# Patient Record
Sex: Female | Born: 1995 | Race: Black or African American | Hispanic: No | Marital: Single | Smoking: Never smoker
Health system: Southern US, Community
[De-identification: ages and names within clinical notes are randomized; demographics above are authoritative.]

## PROBLEM LIST (undated history)

## (undated) DIAGNOSIS — I10 Essential (primary) hypertension: Secondary | ICD-10-CM

## (undated) DIAGNOSIS — T7840XA Allergy, unspecified, initial encounter: Secondary | ICD-10-CM

## (undated) DIAGNOSIS — O1420 HELLP syndrome (HELLP), unspecified trimester: Secondary | ICD-10-CM

## (undated) DIAGNOSIS — J45909 Unspecified asthma, uncomplicated: Secondary | ICD-10-CM

## (undated) DIAGNOSIS — R519 Headache, unspecified: Secondary | ICD-10-CM

## (undated) HISTORY — PX: WISDOM TOOTH EXTRACTION: SHX21

## (undated) HISTORY — DX: Allergy, unspecified, initial encounter: T78.40XA

## (undated) HISTORY — DX: HELLP syndrome (HELLP), unspecified trimester: O14.20

## (undated) HISTORY — DX: Essential (primary) hypertension: I10

---

## 2013-04-10 DIAGNOSIS — M549 Dorsalgia, unspecified: Secondary | ICD-10-CM | POA: Insufficient documentation

## 2013-06-12 DIAGNOSIS — J45909 Unspecified asthma, uncomplicated: Secondary | ICD-10-CM | POA: Insufficient documentation

## 2014-09-08 ENCOUNTER — Emergency Department (HOSPITAL_COMMUNITY)
Admission: EM | Admit: 2014-09-08 | Discharge: 2014-09-08 | Disposition: A | Discharge status: Home / Self Care | Payer: PRIVATE HEALTH INSURANCE | Attending: Emergency Medicine | Admitting: Emergency Medicine

## 2014-09-08 ENCOUNTER — Encounter (HOSPITAL_COMMUNITY): Payer: Self-pay | Admitting: Emergency Medicine

## 2014-09-08 DIAGNOSIS — M545 Low back pain, unspecified: Secondary | ICD-10-CM | POA: Insufficient documentation

## 2014-09-08 DIAGNOSIS — E663 Overweight: Secondary | ICD-10-CM | POA: Diagnosis not present

## 2014-09-08 MED ORDER — HYDROCODONE-ACETAMINOPHEN 5-325 MG PO TABS
2.0000 | ORAL_TABLET | Freq: Once | ORAL | Status: AC
  Administered 2014-09-08: 2 via ORAL
  Filled 2014-09-08: qty 2

## 2014-09-08 MED ORDER — METHOCARBAMOL 500 MG PO TABS: 1000.0000 mg | ORAL_TABLET | Freq: Four times a day (QID) | ORAL | Status: DC | PRN

## 2014-09-08 MED ORDER — HYDROCODONE-ACETAMINOPHEN 5-325 MG PO TABS: ORAL_TABLET | ORAL | Status: DC

## 2014-09-08 MED ORDER — METHOCARBAMOL 500 MG PO TABS
1000.0000 mg | ORAL_TABLET | Freq: Once | ORAL | Status: AC
  Administered 2014-09-08: 1000 mg via ORAL
  Filled 2014-09-08: qty 2

## 2014-09-08 NOTE — ED Notes (Signed)
Pt reports lower back pain for the past week- denies new injury.  Pt reports having imaging done in the past that showed bulging discs but she cannot remember location.  Denies pain or difficulty with urination.  Ambulatory from triage without difficulty.

## 2014-09-08 NOTE — Discharge Instructions (Signed)
Please take ibuprofen 400mg  (this is normally 2 over the counter pills) every 6 hours (take with food to minimze stomach irritation).   Take robaxin and/or Vicodin for breakthrough pain, do not drink alcohol, drive, care for children or perfom other critical tasks while taking robaxin and/or Vicodin .  Please follow with your primary care doctor in the next 2 days for a check-up. They must obtain records for further management.   Do not hesitate to return to the Emergency Department for any new, worsening or concerning symptoms.   Do not hesitate to return to the emergency room for any new, worsening or concerning symptoms.  Please obtain primary care using resource guide below. But the minute you were seen in the emergency room and that they will need to obtain records for further outpatient management.   Back Pain, Adult Low back pain is very common. About 1 in 5 people have back pain.The cause of low back pain is rarely dangerous. The pain often gets better over time.About half of people with a sudden onset of back pain feel better in just 2 weeks. About 8 in 10 people feel better by 6 weeks.  CAUSES Some common causes of back pain include:  Strain of the muscles or ligaments supporting the spine.  Wear and tear (degeneration) of the spinal discs.  Arthritis.  Direct injury to the back. DIAGNOSIS Most of the time, the direct cause of low back pain is not known.However, back pain can be treated effectively even when the exact cause of the pain is unknown.Answering your caregiver's questions about your overall health and symptoms is one of the most accurate ways to make sure the cause of your pain is not dangerous. If your caregiver needs more information, he or she may order lab work or imaging tests (X-rays or MRIs).However, even if imaging tests show changes in your back, this usually does not require surgery. HOME CARE INSTRUCTIONS For many people, back pain returns.Since low  back pain is rarely dangerous, it is often a condition that people can learn to [facility] their own.   Remain active. It is stressful on the back to sit or stand in one place. Do not sit, drive, or stand in one place for more than 30 minutes at a time. Take short walks on level surfaces as soon as pain allows.Try to increase the length of time you walk each day.  Do not stay in bed.Resting more than 1 or 2 days can delay your recovery.  Do not avoid exercise or work.Your body is made to move.It is not dangerous to be active, even though your back may hurt.Your back will likely heal faster if you return to being active before your pain is gone.  Pay attention to your body when you bend and lift. Many people have less discomfortwhen lifting if they bend their knees, keep the load close to their bodies,and avoid twisting. Often, the most comfortable positions are those that put less stress on your recovering back.  Find a comfortable position to sleep. Use a firm mattress and lie on your side with your knees slightly bent. If you lie on your back, put a pillow under your knees.  Only take over-the-counter or prescription medicines as directed by your caregiver. Over-the-counter medicines to reduce pain and inflammation are often the most helpful.Your caregiver may prescribe muscle relaxant drugs.These medicines help dull your pain so you can more quickly return to your normal activities and healthy exercise.  Put ice on the  injured area.  Put ice in a plastic bag.  Place a towel between your skin and the bag.  Leave the ice on for 15-20 minutes, 03-04 times a day for the first 2 to 3 days. After that, ice and heat may be alternated to reduce pain and spasms.  Ask your caregiver about trying back exercises and gentle massage. This may be of some benefit.  Avoid feeling anxious or stressed.Stress increases muscle tension and can worsen back pain.It is important to recognize when you  are anxious or stressed and learn ways to manage it.Exercise is a great option. SEEK MEDICAL CARE IF:  You have pain that is not relieved with rest or medicine.  You have pain that does not improve in 1 week.  You have new symptoms.  You are generally not feeling well. SEEK IMMEDIATE MEDICAL CARE IF:   You have pain that radiates from your back into your legs.  You develop new bowel or bladder control problems.  You have unusual weakness or numbness in your arms or legs.  You develop nausea or vomiting.  You develop abdominal pain.  You feel faint. Document Released: 12/05/2005 Document Revised: 06/05/2012 Document Reviewed: 04/08/2014 [facility] Patient Information 2015 [place], [place]. This information is not intended to replace advice given to you by your health care provider. Make sure you discuss any questions you have with your health care provider.  Emergency Department Resource Guide 1) Find a Doctor and Pay Out of Pocket Although you won't have to find out who is covered by your insurance plan, it is a good idea to ask around and get recommendations. You will then need to call the office and see if the doctor you have chosen will accept you as a new patient and what types of options they offer for patients who are self-pay. Some doctors offer discounts or will set up payment plans for their patients who do not have insurance, but you will need to ask so you aren't surprised when you get to your appointment.  2) Contact Your Local Health Department Not all health departments have doctors that can see patients for sick visits, but many do, so it is worth a call to see if yours does. If you don't know where your local health department is, you can check in your phone book. The CDC also has a tool to help you locate your state's health department, and many state websites also have listings of all of their local health departments.  3) Find a Walk-in Clinic If your illness is not  likely to be very severe or complicated, you may want to try a walk in clinic. These are popping up all over the country in pharmacies, drugstores, and shopping centers. They're usually staffed by nurse practitioners or physician assistants that have been trained to treat common illnesses and complaints. They're usually fairly quick and inexpensive. However, if you have serious medical issues or chronic medical problems, these are probably not your best option.  No Primary Care Doctor: - Call Health Connect at  [phone] - they can help you locate a primary care doctor that  accepts your insurance, provides certain services, etc. - Physician Referral Service- [phone]  Chronic Pain Problems: Organization         Address  Phone   Notes  [facility]  [phone] Patients need to be referred by their primary care doctor.   Medication Assistance: Organization         Address  Phone   Notes  [facility] Medication [facility] [address] [place] [place], [place] [address] [phone] --Must be a resident of [facility] -- Must have NO insurance coverage whatsoever (no Medicaid/ Medicare, etc.) -- The pt. MUST have a primary care doctor that directs their care regularly and follows them in the community   Med[facility]ist  [phone]   [employer]  [phone]    Agencies that provide inexpensive medical care: Organization         Address  Phone   Notes  [facility]  [phone]   [facility]    [phone]   [facility] [address] [place], [place] [address] [phone]   [facility] [address] [place] [place] [phone]   Planned Parenthood    [phone]   [facility]    [phone]   [facility]  [address], [place] Phone:  [phone], Fax:  [phone] Hours of Operation:  9 am - 6 pm,  M-F.  Also accepts Medicaid/Medicare and self-pay.  [facility]  [address], [place] Phone: [phone], Fax: [phone]. Hours of Operation:  8:30 am - 5:30 pm, M-F.  Also accepts Medicaid and self-pay.  [facility] [place] [address] [place] Phone: [phone]   [facility] [address] Natasha Bence [place], [place] [phone], Ext. [phone] Mondays & Thursdays: 7-9 AM.  First 15 patients are seen on a first come, first serve basis.    Medicaid-accepting [facility] Providers:  Organization         Address  Phone   Notes  [facility] [address], [place] [phone] Also accepts self-pay patients.  [facility] [address] Laurell Josephs [place], [place]  [phone]   [facility] [address], [place] [phone]   [facility] [address] [place] [phone]   Renaye Rakers [address], [place]   [phone] Only accepts [place] Access [place] patients after they have their name applied to their card.   Self-Pay (no insurance) in [facility]:  Organization         Address  Phone   Notes  Sickle Cell Patients, [facility] [address] [place], [place] [phone]   [facility] [address] [place], [place] [phone]   [facility]  [address], [place] [phone]   [facility]/Dr. Osei-Bonsu  [address], [place] or [address], [place] [phone] Phone number for both [place] and [place] locations is the same.  [facility] [address] [place] [phone]   [facility] [address] [place] or [address] [phone]   [facility] [address] [place] [phone], phone; [phone], fax Sees patients 1st and 3rd Saturday of every month.  Must not qualify for public or private insurance (i.e. Medicaid, Medicare, [place] Health Choice, Veterans' Benefits)  Household income should be no more than 200% of the poverty level The clinic cannot treat you if you are pregnant or think you are pregnant  Sexually transmitted diseases are not treated at the clinic.    Dental Care: Organization         Address  Phone  Notes  [facility] Department of [facility] [address] [place], [place] [phone] Accepts children up to age 45 who are enrolled in [place] or [place] Health Choice; pregnant women with a Medicaid card; and children who have applied for Medicaid or [place] Health Choice, but were declined, whose parents can pay a reduced fee at time of service.  [facility] Department of [facility]  [address], [place] [phone] Accepts children up to age 70 who are enrolled in [place] or [place] Health Choice; pregnant women with a Medicaid card; and children who have applied for Medicaid or [place] Health Choice, but were declined, whose parents can pay a reduced fee at time of service.  [facility]  [address] [place], [place] [phone] Patients are seen by appointment only. Walk-ins are not accepted. [facility] will see patients 56 years of age and older. Monday - Tuesday (8am-5pm) Most Wednesdays (8:30-5pm) $30 per visit, cash only  [facility]  [address], [facility] [phone] Patients are seen by appointment only. Walk-ins are not accepted. [facility] will see patients 59 years of age and older. One Wednesday Evening (Monthly: Volunteer Based).  $30 per visit, cash only  [employer]  [phone] for adults; Children under age 79, call Graduate Pediatric Dentistry at [phone]. Children aged 25-14, please call [phone] to request a pediatric application.  Dental services are provided in all areas of dental care including fillings, crowns and bridges, complete and partial dentures, implants, gum treatment, root canals, and extractions. Preventive care is also provided. Treatment is provided to both adults and children. Patients are selected via a lottery and there is often a waiting list.   [facility] [address] [place]  [phone] [email or web address]   [facility] [address] [place], [place] [phone], Ext. [phone] Second and Fourth Thursday of each month, opens at 6:30 AM; Clinic ends at 9 AM.  Patients are seen on a first-come first-served basis, and a limited number are seen during each clinic.   [facility]  [address] [place] [address] Ether Griffins [place], [place] [phone]   Eligibility Requirements You must have lived in [place], [place], or [place] counties for at least the last three months.   You cannot be eligible for state or federal sponsored [place], including [employer], [place], or [employer].   You generally cannot be eligible for healthcare insurance through your employer.    How to apply: Eligibility screenings are held every Tuesday and Wednesday afternoon from 1:00 pm until 4:00 pm. You do not need an appointment for the interview!  [facility] [address], [place], [place] [phone]   [facility] Health Department  [phone]   [facility] Department  [phone]   [facility] Health Department  [phone]    Behavioral Health Resources in the Community: Intensive Outpatient Programs Organization         Address  Phone  Notes  [facility] [address] [place], [place] [phone]   [facility] Outpatient [address] [place], [place] [phone]   [facility]  [address] [place], [place]  [phone]   [facility] Mental Health [address]  [place], [place] [phone] or [phone]   Substance Abuse Resources Organization         Address  Phone  Notes  [facility]  [phone]   [facility]  [phone]   [facility] [place]  [phone]   Floydene Flock  [phone]   Residential & Outpatient Substance Abuse Program  [phone]   Psychological Services Organization         Address  Phone  Notes  [facility]  [phone]   [facility] Services  [phone]   [facility] Mental Health [address] [place] [phone] or [phone]    Mobile Crisis Teams Organization         Address  Phone  Notes  [facility], Mobile Crisis Care Unit  [phone]   [facility]  [address] [place], [place] [phone]   Doristine Locks [address] [place] [phone]    Self-Help/Support Groups Organization         Address  Phone             Notes  [facility] - variety of support groups  [phone]- I7437963 Call for more information  Narcotics Anonymous (NA), [facility] [address], [employer] [place]  2 meetings at this location   Statistician         Address  Phone  Notes  [facility] [address],    [facility] [place]  [phone]   [facility]  [address] [place] [address], [place], [place] [phone]   [facility] [address] [place], [place] [phone] Admissions: 8am-3pm M-F  [facility] [address]    [place], [place] [phone]   [facility] [address] [place], [place], [place] [phone]   [facility] [address]  [place], [place] [phone]   [facility] - Intensive Outpatient [address] Laurell Josephs [address], [place], [place]  [phone]   [facility] ([facility]iction [facility] [address]  [place], [place] [phone] or [phone]   [facility] ([facility]) [address] [place], [place] [phone] Accepts Medicaid  [facility] [address],  [place] [phone] Substance Abuse/Addiction Treatment   [facility] Organization         Address  Phone  Notes  [facility]  [phone]   Angie Fava, PhD [address] Ervin Knack [place], [place]   [phone] or 9203382023   [facility]   [address] [place], [place] [phone]   [facility] [address] [place], [place] [phone] Insurance/Medicaid/sponsorship through [facility] [address]                                    [place], [place] [phone] Therapy/tele-psych/case  [facility] [address], [place] [phone]    Dr. Lolly Mustache  [phone]   [facility]  [employer] [facility] 1) [address] [place] 2) [address] [place] 3)  [address], [place] [phone]  [phone]   [facility] Child Abuse Hotline [phone] or [phone] (  After Hours)

## 2014-09-08 NOTE — ED Provider Notes (Signed)
CSN: [id number]     Arrival date & time 09/08/14  2109 History  This chart was scribed for non-physician practitioner, Brianna Emery, PA-C working with Mirian Mo, MD by Greggory Stallion, ED scribe. This patient was seen in room TR11C/TR11C and the patient's care was started at 10:19 PM.   Chief Complaint  Patient presents with  . Back Pain   The history is provided by the patient. No language interpreter was used.   HPI Comments: Brianna Gilmore is a 18 y.o. female with who presents to the Emergency Department complaining of gradual onset lower back pain that started one week ago. Denies new injury. Pain goes into her upper back in the mornings but states it starts resolving in the upper back throughout the day. Standing up worsens the pain and causes it to become very sharp. Reports history of lower back pain due to bulging discs but states it has recently worsened. Pt has taken tylenol #3, ibuprofen and flexeril with no relief. Denies history of hypertension, diabetes, caner or IV drug use. Denies fever, chills, bowel or bladder incontinence.   History reviewed. No pertinent past medical history. History reviewed. No pertinent past surgical history. No family history on file. History  Substance Use Topics  . Smoking status: Never Smoker   . Smokeless tobacco: Not on file  . Alcohol Use: No   OB History   Grav Para Term Preterm Abortions TAB SAB Ect Mult Living                 Review of Systems  Constitutional: Negative for fever and chills.  Genitourinary:       Negative for bowel or bladder incontinence.  Musculoskeletal: Positive for back pain.  All other systems reviewed and are negative.  Allergies  Review of patient's allergies indicates no known allergies.  Home Medications   Prior to Admission medications   Medication Sig Start Date End Date Taking? Authorizing Provider  HYDROcodone-acetaminophen (NORCO/VICODIN) 5-325 MG per tablet Take 1-2 tablets by mouth every  6 hours as needed for pain. 09/08/14   Loyola Santino, PA-C  methocarbamol (ROBAXIN) 500 MG tablet Take 2 tablets (1,000 mg total) by mouth 4 (four) times daily as needed (Pain). 09/08/14   Ama Mcmaster, PA-C   BP 132/66  Pulse 67  Temp(Src) 98 F (36.7 C)  Resp 13  Wt 254 lb 4 oz (115.327 kg)  SpO2 98%  Physical Exam  Nursing note and vitals reviewed. Constitutional: She is oriented to person, place, and time. She appears well-developed and well-nourished. No distress.  Overweight  HENT:  Head: Normocephalic.  Eyes: Conjunctivae and EOM are normal.  Neck: Normal range of motion.  Cardiovascular: Normal rate, regular rhythm and intact distal pulses.   Pulmonary/Chest: Effort normal. No stridor.  Abdominal: Soft. There is no tenderness.  Musculoskeletal: Normal range of motion.  Neurological: She is alert and oriented to person, place, and time.  No point tenderness to percussion of lumbar spinal processes.  No TTP or paraspinal muscular spasm. Strength is 5 out of 5 to bilateral lower extremities at hip and knee; extensor hallucis longus 5 out of 5. Ankle strength 5 out of 5, no clonus, neurovascularly intact. No saddle anaesthesia. Patellar reflexes are 2+ bilaterally.     Psychiatric: She has a normal mood and affect.    ED Course  Procedures (including critical care time)  DIAGNOSTIC STUDIES: Oxygen Saturation is 97% on RA, normal by my interpretation.    COORDINATION OF CARE: 10:22 PM-Discussed  treatment plan which includes robaxin and vicodin with pt at bedside and pt agreed to plan. Will give pt an orthopedic referral and advised her to follow up.   Labs Review Labs Reviewed - No data to display  Imaging Review No results found.   EKG Interpretation None      MDM   Final diagnoses:  Bilateral low back pain without sciatica    Filed Vitals:   09/08/14 2123 09/08/14 2233  BP: 127/69 132/66  Pulse: 76 67  Temp: 98 F (36.7 C)   Resp: 20 13  Weight:  254 lb 4 oz (115.327 kg)   SpO2: 97% 98%    Medications  methocarbamol (ROBAXIN) tablet 1,000 mg (1,000 mg Oral Given 09/08/14 2233)  HYDROcodone-acetaminophen (NORCO/VICODIN) 5-325 MG per tablet 2 tablet (2 tablets Oral Given 09/08/14 2232)    [name] is a 18 y.o. female presenting with  back pain.  No neurological deficits and normal neuro exam.  Patient can walk but states is painful.  No loss of bowel or bladder control.  No concern for cauda equina.  No fever, night sweats, weight loss, h/o cancer, IVDU.  RICE protocol and pain medicine indicated and discussed with patient.  Evaluation does not show pathology that would require ongoing emergent intervention or inpatient treatment. Pt is hemodynamically stable and mentating appropriately. Discussed findings and plan with patient/guardian, who agrees with care plan. All questions answered. Return precautions discussed and outpatient follow up given.    I personally performed the services described in this documentation, which was scribed in my presence. The recorded information has been reviewed and is accurate.  Brianna Emery, PA-C 09/08/14 2326

## 2014-09-10 NOTE — ED Provider Notes (Signed)
Medical screening examination/treatment/procedure(s) were performed by non-physician practitioner and as supervising physician I was immediately available for consultation/collaboration.   EKG Interpretation None        Mirian Mo, MD 09/10/14 1950

## 2020-08-07 LAB — HM PAP SMEAR: HM Pap smear: NEGATIVE

## 2020-08-07 LAB — LIPID PANEL
Cholesterol: 112 (ref 0–200)
HDL: 35 (ref 35–70)
LDL Cholesterol: 65
LDl/HDL Ratio: 1.9
Triglycerides: 53 (ref 40–160)

## 2020-08-07 LAB — BASIC METABOLIC PANEL: Glucose: 77

## 2020-08-07 LAB — HIV ANTIBODY (ROUTINE TESTING W REFLEX): HIV 1&2 Ab, 4th Generation: NONREACTIVE

## 2021-05-04 ENCOUNTER — Emergency Department
Admission: EM | Admit: 2021-05-04 | Discharge: 2021-05-04 | Disposition: A | Discharge status: Home / Self Care | Payer: BC Managed Care – PPO | Attending: Emergency Medicine | Admitting: Emergency Medicine

## 2021-05-04 ENCOUNTER — Other Ambulatory Visit: Payer: Self-pay

## 2021-05-04 ENCOUNTER — Encounter: Payer: Self-pay | Admitting: Emergency Medicine

## 2021-05-04 DIAGNOSIS — R350 Frequency of micturition: Secondary | ICD-10-CM | POA: Diagnosis present

## 2021-05-04 DIAGNOSIS — R351 Nocturia: Secondary | ICD-10-CM | POA: Insufficient documentation

## 2021-05-04 LAB — URINALYSIS, COMPLETE (UACMP) WITH MICROSCOPIC
Bilirubin Urine: NEGATIVE
Glucose, UA: NEGATIVE mg/dL
Hgb urine dipstick: NEGATIVE
Ketones, ur: NEGATIVE mg/dL
Leukocytes,Ua: NEGATIVE
Nitrite: NEGATIVE
Protein, ur: NEGATIVE mg/dL
Specific Gravity, Urine: 1.005 (ref 1.005–1.030)
pH: 6 (ref 5.0–8.0)

## 2021-05-04 LAB — POC URINE PREG, ED: Preg Test, Ur: NEGATIVE

## 2021-05-04 MED ORDER — PHENAZOPYRIDINE HCL 200 MG PO TABS: 200.0000 mg | ORAL_TABLET | Freq: Three times a day (TID) | ORAL | 0 refills | Status: DC | PRN

## 2021-05-04 MED ORDER — ONDANSETRON HCL 8 MG PO TABS: 8.0000 mg | ORAL_TABLET | Freq: Three times a day (TID) | ORAL | 0 refills | Status: DC | PRN

## 2021-05-04 NOTE — Discharge Instructions (Addendum)
No acute findings on urinalysis today.  Read and follow discharge care instructions.  Advised Pyridium may change color of urine.    Follow-up with schedule GYN doctor next week.  Return to ED if condition worsen before you schedule appointment.

## 2021-05-04 NOTE — ED Triage Notes (Signed)
Pt reports had a HA last week and reports this week she has had some frequent urination. Pt reports no pain with urination but has been peeing a lot and getting up at night a lot to pee. Pt reports also feels nauseated some times

## 2021-05-04 NOTE — ED Provider Notes (Signed)
[facility] Emergency Department Provider Note   ____________________________________________   Event Date/Time   First MD Initiated Contact with Patient 05/04/21 1026     (approximate)  I have reviewed the triage vital signs and the nursing notes.   HISTORY  Chief Complaint Urinary Frequency    HPI Brianna Gilmore is a 25 y.o. female patient complain urinary frequency without dysuria for 1 week.  Patient states also increasing nocturia.  Denies vaginal discharge or incontinence.  Denies polydipsia.         History reviewed. No pertinent past medical history.  There are no problems to display for this patient.   History reviewed. No pertinent surgical history.  Prior to Admission medications   Medication Sig Start Date End Date Taking? Authorizing Provider  ondansetron (ZOFRAN) 8 MG tablet Take 1 tablet (8 mg total) by mouth every 8 (eight) hours as needed for nausea or vomiting. 05/04/21  Yes Joni Reining, PA-C  phenazopyridine (PYRIDIUM) 200 MG tablet Take 1 tablet (200 mg total) by mouth 3 (three) times daily as needed for pain. 05/04/21  Yes Joni Reining, PA-C    Allergies Zithromax [azithromycin] and Penicillins  No family history on file.  Social History Social History   Tobacco Use  . Smoking status: Never Smoker  Substance Use Topics  . Alcohol use: No    Review of Systems Constitutional: No fever/chills Eyes: No visual changes. ENT: No sore throat. Cardiovascular: Denies chest pain. Respiratory: Denies shortness of breath. Gastrointestinal: No abdominal pain.  No nausea, no vomiting.  No diarrhea.  No constipation. Genitourinary: Urinary frequency musculoskeletal: Negative for back pain. Skin: Negative for rash. Neurological: Negative for headaches, focal weakness or numbness. Allergic/Immunilogical: Zithromax and penicillin ____________________________________________   PHYSICAL EXAM:  VITAL SIGNS: ED Triage  Vitals  Enc Vitals Group     BP 05/04/21 1016 122/83     Pulse Rate 05/04/21 1016 73     Resp 05/04/21 1016 18     Temp 05/04/21 1016 98.6 F (37 C)     Temp Source 05/04/21 1016 Oral     SpO2 05/04/21 1016 95 %     Weight 05/04/21 1012 287 lb (130.2 kg)     Height 05/04/21 1012 5\' 3"  (1.6 m)     Head Circumference --      Peak Flow --      Pain Score 05/04/21 1012 0     Pain Loc --      Pain Edu? --      Excl. in GC? --     Constitutional: Alert and oriented. Well appearing and in no acute distress. Cardiovascular: Normal rate, regular rhythm. Grossly normal heart sounds.  Good peripheral circulation. Respiratory: Normal respiratory effort.  No retractions. Lungs CTAB. Gastrointestinal: Soft and nontender. No distention. No abdominal bruits. No CVA tenderness. Genitourinary: Deferred Musculoskeletal: No lower extremity tenderness nor edema.  No joint effusions. Neurologic:  Normal speech and language. No gross focal neurologic deficits are appreciated. No gait instability. Skin:  Skin is warm, dry and intact. No rash noted. Psychiatric: Mood and affect are normal. Speech and behavior are normal.  ____________________________________________   LABS (all labs ordered are listed, but only abnormal results are displayed)  Labs Reviewed  URINALYSIS, COMPLETE (UACMP) WITH MICROSCOPIC - Abnormal; Notable for the following components:      Result Value   Color, Urine STRAW (*)    APPearance CLEAR (*)    Bacteria, UA RARE (*)  All other components within normal limits  POC URINE PREG, ED   ____________________________________________  EKG   ____________________________________________  RADIOLOGY I, Joni Reining, personally viewed and evaluated these images (plain radiographs) as part of my medical decision making, as well as reviewing the written report by the radiologist.  ED MD interpretation:    Official radiology report(s): No results  found.  ____________________________________________   PROCEDURES  Procedure(s) performed (including Critical Care):  Procedures   ____________________________________________   INITIAL IMPRESSION / ASSESSMENT AND PLAN / ED COURSE  As part of my medical decision making, I reviewed the following data within the electronic MEDICAL RECORD NUMBER         Patient presents with urinary frequency for 1 week.  Differentials consist UTI and bladder dysfunction.  Discussed no acute findings on urinalysis.  Advised patient to follow-up with scheduled GYN appointment next week for definitive evaluation and treatment      ____________________________________________   FINAL CLINICAL IMPRESSION(S) / ED DIAGNOSES  Final diagnoses:  Urinary frequency     ED Discharge Orders         Ordered    phenazopyridine (PYRIDIUM) 200 MG tablet  3 times daily PRN        05/04/21 1048    ondansetron (ZOFRAN) 8 MG tablet  Every 8 hours PRN        05/04/21 1050          *Please note:  Brianna Gilmore was evaluated in Emergency Department on 05/04/2021 for the symptoms described in the history of present illness. She was evaluated in the context of the global COVID-19 pandemic, which necessitated consideration that the patient might be at risk for infection with the SARS-CoV-2 virus that causes COVID-19. Institutional protocols and algorithms that pertain to the evaluation of patients at risk for COVID-19 are in a state of rapid change based on information released by regulatory bodies including the CDC and federal and state organizations. These policies and algorithms were followed during the patient's care in the ED.  Some ED evaluations and interventions may be delayed as a result of limited staffing during and the pandemic.*   Note:  This document was prepared using Dragon voice recognition software and may include unintentional dictation errors.    Joni Reining, PA-C 05/04/21 1055     Minna Antis, MD 05/04/21 1334

## 2021-05-04 NOTE — ED Notes (Signed)
See triage note  Presents with h/a for 1 week  Some nausea/vomiting states h/a is not as bad today  But developed some dysuria on Saturday  Afebrile on arrival

## 2021-05-11 ENCOUNTER — Other Ambulatory Visit: Payer: Self-pay

## 2021-05-11 ENCOUNTER — Encounter: Payer: Self-pay | Admitting: Certified Nurse Midwife

## 2021-05-11 ENCOUNTER — Ambulatory Visit (INDEPENDENT_AMBULATORY_CARE_PROVIDER_SITE_OTHER): Payer: BC Managed Care – PPO | Admitting: Certified Nurse Midwife

## 2021-05-11 VITALS — BP 115/82 | HR 69 | Resp 16 | Ht 64.0 in | Wt 272.9 lb

## 2021-05-11 DIAGNOSIS — Z3046 Encounter for surveillance of implantable subdermal contraceptive: Secondary | ICD-10-CM | POA: Diagnosis not present

## 2021-05-11 NOTE — Patient Instructions (Signed)
Nexplanon Instructions After care ° °Keep bandage clean and dry for 24 hours ° °May use ice/Tylenol/Ibuprofen for soreness or pain ° °If you develop fever, drainage or increased warmth from incision site-contact office immediately ° ° °

## 2021-05-11 NOTE — Progress Notes (Signed)
Brianna Gilmore is a 25 y.o. year old No obstetric history on file. African American female here for Nexplanon removal .  She was given informed consent for removal  of her Nexplanon. Her Nexplanon was placed 3 years ago, No LMP recorded. Patient has had an implant.  BP 115/82   Pulse 69   Resp 16   Ht 5\' 4"  (1.626 m)   Wt 272 lb 14.4 oz (123.8 kg)   BMI 46.84 kg/m  No LMP recorded. Patient has had an implant. No results found for this or any previous visit (from the past 24 hour(s)).   Appropriate time out taken. Nexplanon site identified.  Area prepped in usual sterile fashon. Two cc's of 2% lidocaine was used to anesthetize the area. A small stab incision was made right beside the implant on the distal portion.  The Nexplanon rod was grasped using hemostats and removed intact without difficulty.   Steri-strips and a pressure bandage was applied.  There was less than 3 cc blood loss. There were no complications.  The patient tolerated the procedure well.  She was instructed to keep the area clean and dry, remove pressure bandage in 24 hours, and keep  site covered with the steri-strips for 3-5 days.  She plan on using no birth control and is ok with pregnancy. Follow-up PRN problems.  , CNM

## 2021-06-01 ENCOUNTER — Ambulatory Visit (INDEPENDENT_AMBULATORY_CARE_PROVIDER_SITE_OTHER): Payer: BC Managed Care – PPO | Admitting: Certified Nurse Midwife

## 2021-06-01 ENCOUNTER — Encounter: Payer: Self-pay | Admitting: Certified Nurse Midwife

## 2021-06-01 ENCOUNTER — Other Ambulatory Visit: Payer: Self-pay

## 2021-06-01 VITALS — BP 122/82 | HR 84 | Ht 63.0 in | Wt 273.7 lb

## 2021-06-01 DIAGNOSIS — Z01419 Encounter for gynecological examination (general) (routine) without abnormal findings: Secondary | ICD-10-CM | POA: Diagnosis not present

## 2021-06-01 DIAGNOSIS — G43909 Migraine, unspecified, not intractable, without status migrainosus: Secondary | ICD-10-CM | POA: Insufficient documentation

## 2021-06-01 DIAGNOSIS — Z13220 Encounter for screening for lipoid disorders: Secondary | ICD-10-CM

## 2021-06-01 DIAGNOSIS — E669 Obesity, unspecified: Secondary | ICD-10-CM | POA: Insufficient documentation

## 2021-06-01 NOTE — Patient Instructions (Signed)
Preventive Care 21-27 Years Old, Female Preventive care refers to lifestyle choices and visits with your health care provider that can promote health and wellness. This includes: A yearly physical exam. This is also called an annual wellness visit. Regular dental and eye exams. Immunizations. Screening for certain conditions. Healthy lifestyle choices, such as: Eating a healthy diet. Getting regular exercise. Not using drugs or products that contain nicotine and tobacco. Limiting alcohol use. What can I expect for my preventive care visit? Physical exam Your health care provider may check your: Height and weight. These may be used to calculate your BMI (body mass index). BMI is a measurement that tells if you are at a healthy weight. Heart rate and blood pressure. Body temperature. Skin for abnormal spots. Counseling Your health care provider may ask you questions about your: Past medical problems. Family's medical history. Alcohol, tobacco, and drug use. Emotional well-being. Home life and relationship well-being. Sexual activity. Diet, exercise, and sleep habits. Work and work environment. Access to firearms. Method of birth control. Menstrual cycle. Pregnancy history. What immunizations do I need?  Vaccines are usually given at various ages, according to a schedule. Your health care provider will recommend vaccines for you based on your age, medicalhistory, and lifestyle or other factors, such as travel or where you work. What tests do I need?  Blood tests Lipid and cholesterol levels. These may be checked every 5 years starting at age 20. Hepatitis C test. Hepatitis B test. Screening Diabetes screening. This is done by checking your blood sugar (glucose) after you have not eaten for a while (fasting). STD (sexually transmitted disease) testing, if you are at risk. BRCA-related cancer screening. This may be done if you have a family history of breast, ovarian, tubal, or  peritoneal cancers. Pelvic exam and Pap test. This may be done every 3 years starting at age 21. Starting at age 30, this may be done every 5 years if you have a Pap test in combination with an HPV test. Talk with your health care provider about your test results, treatment options,and if necessary, the need for more tests. Follow these instructions at home: Eating and drinking  Eat a healthy diet that includes fresh fruits and vegetables, whole grains, lean protein, and low-fat dairy products. Take vitamin and mineral supplements as recommended by your health care provider. Do not drink alcohol if: Your health care provider tells you not to drink. You are pregnant, may be pregnant, or are planning to become pregnant. If you drink alcohol: Limit how much you have to 0-1 drink a day. Be aware of how much alcohol is in your drink. In the [place] one drink equals one 12 oz bottle of beer (355 mL), one 5 oz glass of wine (148 mL), or one 1 oz glass of hard liquor (44 mL).  Lifestyle Take daily care of your teeth and gums. Brush your teeth every morning and night with fluoride toothpaste. Floss one time each day. Stay active. Exercise for at least 30 minutes 5 or more days each week. Do not use any products that contain nicotine or tobacco, such as cigarettes, e-cigarettes, and chewing tobacco. If you need help quitting, ask your health care provider. Do not use drugs. If you are sexually active, practice safe sex. Use a condom or other form of protection to prevent STIs (sexually transmitted infections). If you do not wish to become pregnant, use a form of birth control. If you plan to become pregnant, see your health care   provider for a prepregnancy visit. Find healthy ways to cope with stress, such as: Meditation, yoga, or listening to music. Journaling. Talking to a trusted person. Spending time with friends and family. Safety Always wear your seat belt while driving or riding in a  vehicle. Do not drive: If you have been drinking alcohol. Do not ride with someone who has been drinking. When you are tired or distracted. While texting. Wear a helmet and other protective equipment during sports activities. If you have firearms in your house, make sure you follow all gun safety procedures. Seek help if you have been physically or sexually abused. What's next? Go to your health care provider once a year for an annual wellness visit. Ask your health care provider how often you should have your eyes and teeth checked. Stay up to date on all vaccines. This information is not intended to replace advice given to you by your health care provider. Make sure you discuss any questions you have with your healthcare provider. Document Revised: 08/02/2020 Document Reviewed: 08/16/2018 Elsevier Patient Education  2022 [employer].

## 2021-06-01 NOTE — Progress Notes (Signed)
Pt presents for yearly physical, presents no concerns per patient.

## 2021-06-01 NOTE — Progress Notes (Signed)
GYNECOLOGY ANNUAL PREVENTATIVE CARE ENCOUNTER NOTE  History:     Brianna Gilmore is a 25 y.o. No obstetric history on file. female here for a routine annual gynecologic exam.  Current complaints: none.   Denies abnormal vaginal bleeding, discharge, pelvic pain, problems with intercourse or other gynecologic concerns.     Social Relationship: female partner ( not married) Living: with her partner Work: Theatre stage manager of [employer]  Exercise: none Smoke/Alcohol/drug use: occasional alcohol use , denies smoking and drug use.  Gynecologic History Patient's last menstrual period was 05/31/2021 (exact date). Contraception: none, want to conceive in near future Last Pap: 2021. Results were: neg per pt ( medical records requested) Last mammogram: N/A   Upstream - 06/01/21 0941       Pregnancy Intention Screening   Does the patient want to become pregnant in the next year? Yes    Does the patient's partner want to become pregnant in the next year? Yes    Would the patient like to discuss contraceptive options today? N/A      Contraception Wrap Up   Current Method No Contraceptive Precautions    End Method No Contraception Precautions    Contraception Counseling Provided No            The pregnancy intention screening data noted above was reviewed. Potential methods of contraception were discussed. The patient elected to proceed with Pregnant/Seeking Pregnancy.   Obstetric History OB History  No obstetric history on file.    History reviewed. No pertinent past medical history.  History reviewed. No pertinent surgical history.  Current Outpatient Medications on File Prior to Visit  Medication Sig Dispense Refill   ondansetron (ZOFRAN) 8 MG tablet Take 1 tablet (8 mg total) by mouth every 8 (eight) hours as needed for nausea or vomiting. (Patient not taking: Reported on 06/01/2021) 20 tablet 0   phenazopyridine (PYRIDIUM) 200 MG tablet Take 1 tablet (200 mg total)  by mouth 3 (three) times daily as needed for pain. (Patient not taking: Reported on 06/01/2021) 6 tablet 0   No current facility-administered medications on file prior to visit.    Allergies  Allergen Reactions   Zithromax [Azithromycin] Hives   Penicillins Rash    Social History:  reports previous drug use. She reports that she does not drink alcohol.  Family History  Problem Relation Age of Onset   Diabetes Maternal Grandmother    Hypertension Maternal Grandmother     The following portions of the patient's history were reviewed and updated as appropriate: allergies, current medications, past family history, past medical history, past social history, past surgical history and problem list.  Review of Systems Pertinent items noted in HPI and remainder of comprehensive ROS otherwise negative.  Physical Exam:  BP 122/82   Pulse 84   Ht _0  (1.6 m)   Wt 273 lb 11.2 oz (124.1 kg)   LMP 05/31/2021 (Exact Date)   BMI 48.48 kg/m  CONSTITUTIONAL: Well-developed, well-nourished, obese female in no acute distress.  HENT:  Normocephalic, atraumatic, External right and left ear normal. Oropharynx is clear and moist EYES: Conjunctivae and EOM are normal. Pupils are equal, round, and reactive to light. No scleral icterus.  NECK: Normal range of motion, supple, no masses.  Normal thyroid.  SKIN: Skin is warm and dry. No rash noted. Not diaphoretic. No erythema. No pallor. MUSCULOSKELETAL: Normal range of motion. No tenderness.  No cyanosis, clubbing, or edema.  2+ distal pulses. NEUROLOGIC: Alert and oriented to  person, place, and time. Normal reflexes, muscle tone coordination.  PSYCHIATRIC: Normal mood and affect. Normal behavior. Normal judgment and thought content. CARDIOVASCULAR: Normal heart rate noted, regular rhythm RESPIRATORY: Clear to auscultation bilaterally. Effort and breath sounds normal, no problems with respiration noted. BREASTS: Symmetric in size. No masses,  tenderness, skin changes, nipple drainage, or lymphadenopathy bilaterally.  ABDOMEN: Soft, no distention noted.  No tenderness, rebound or guarding.  PELVIC: Normal appearing external genitalia and urethral meatus; normal appearing vaginal mucosa and cervix.  On period , blood present. No abnormal discharge noted.  Pap smear not due  Normal uterine size, no other palpable masses, no uterine or adnexal tenderness.  .   Assessment and Plan:    1. Women's annual routine gynecological examination   Pap: not indicated  Mammogram :not indicated Labs: lipid profile, ( pts state has had Hep c/HIV, request medical records) Refills: none Referral: none Discussed PNV, timing of intercourse, used of App and ovulation kit.  Routine preventative health maintenance measures emphasized. Please refer to After Visit Summary for other counseling recommendations.      Philip Aspen, CNM [facility]

## 2021-06-02 LAB — LIPID PANEL
Chol/HDL Ratio: 3.5 ratio (ref 0.0–4.4)
Cholesterol, Total: 127 mg/dL (ref 100–199)
HDL: 36 mg/dL — ABNORMAL LOW (ref >39)
LDL Chol Calc (NIH): 77 mg/dL (ref 0–99)
Triglycerides: 65 mg/dL (ref 0–149)
VLDL Cholesterol Cal: 14 mg/dL (ref 5–40)

## 2021-09-06 ENCOUNTER — Emergency Department: Payer: BC Managed Care – PPO

## 2021-09-06 ENCOUNTER — Telehealth: Payer: Self-pay | Admitting: Certified Nurse Midwife

## 2021-09-06 ENCOUNTER — Encounter: Payer: Self-pay | Admitting: *Deleted

## 2021-09-06 ENCOUNTER — Other Ambulatory Visit: Payer: Self-pay

## 2021-09-06 DIAGNOSIS — O209 Hemorrhage in early pregnancy, unspecified: Secondary | ICD-10-CM | POA: Insufficient documentation

## 2021-09-06 DIAGNOSIS — R58 Hemorrhage, not elsewhere classified: Secondary | ICD-10-CM

## 2021-09-06 DIAGNOSIS — Z5321 Procedure and treatment not carried out due to patient leaving prior to being seen by health care provider: Secondary | ICD-10-CM | POA: Insufficient documentation

## 2021-09-06 DIAGNOSIS — Z3A00 Weeks of gestation of pregnancy not specified: Secondary | ICD-10-CM | POA: Insufficient documentation

## 2021-09-06 DIAGNOSIS — R102 Pelvic and perineal pain: Secondary | ICD-10-CM | POA: Insufficient documentation

## 2021-09-06 LAB — URINALYSIS, COMPLETE (UACMP) WITH MICROSCOPIC
Bacteria, UA: NONE SEEN
Bilirubin Urine: NEGATIVE
Glucose, UA: NEGATIVE mg/dL
Ketones, ur: NEGATIVE mg/dL
Leukocytes,Ua: NEGATIVE
Nitrite: NEGATIVE
Protein, ur: NEGATIVE mg/dL
Specific Gravity, Urine: >1.03 — ABNORMAL HIGH (ref 1.005–1.030)
pH: 5.5 (ref 5.0–8.0)

## 2021-09-06 LAB — COMPREHENSIVE METABOLIC PANEL
ALT: 15 U/L (ref 0–44)
AST: 17 U/L (ref 15–41)
Albumin: 3.3 g/dL — ABNORMAL LOW (ref 3.5–5.0)
Alkaline Phosphatase: 67 U/L (ref 38–126)
Anion gap: 8 (ref 5–15)
BUN: 10 mg/dL (ref 6–20)
CO2: 26 mmol/L (ref 22–32)
Calcium: 9.1 mg/dL (ref 8.9–10.3)
Chloride: 104 mmol/L (ref 98–111)
Creatinine, Ser: 0.84 mg/dL (ref 0.44–1.00)
GFR, Estimated: >60 mL/min (ref >60)
Glucose, Bld: 99 mg/dL (ref 70–99)
Potassium: 3.7 mmol/L (ref 3.5–5.1)
Sodium: 138 mmol/L (ref 135–145)
Total Bilirubin: 0.7 mg/dL (ref 0.3–1.2)
Total Protein: 7.3 g/dL (ref 6.5–8.1)

## 2021-09-06 LAB — CBC
HCT: 40.6 % (ref 36.0–46.0)
Hemoglobin: 13.5 g/dL (ref 12.0–15.0)
MCH: 29.6 pg (ref 26.0–34.0)
MCHC: 33.3 g/dL (ref 30.0–36.0)
MCV: 89 fL (ref 80.0–100.0)
Platelets: 382 10*3/uL (ref 150–400)
RBC: 4.56 MIL/uL (ref 3.87–5.11)
RDW: 13.2 % (ref 11.5–15.5)
WBC: 18.1 10*3/uL — ABNORMAL HIGH (ref 4.0–10.5)
nRBC: 0 % (ref 0.0–0.2)

## 2021-09-06 LAB — HCG, QUANTITATIVE, PREGNANCY: hCG, Beta Chain, Quant, S: 104 m[IU]/mL — ABNORMAL HIGH (ref <5)

## 2021-09-06 LAB — POC URINE PREG, ED: Preg Test, Ur: POSITIVE — AB

## 2021-09-06 LAB — ABO/RH: ABO/RH(D): B POS

## 2021-09-06 NOTE — ED Notes (Signed)
Poct pregnancy Positive 

## 2021-09-06 NOTE — ED Triage Notes (Signed)
Pt states she had a positive home pregnancy test today.  Pt now has cramping and small amount of vag bleeding.  No urinary sx  pt alert  speech clear.

## 2021-09-06 NOTE — Telephone Encounter (Signed)
Pt called to schedule her preg. confirmatio- LMP 07-26-2021 est almost 7 weeks. Pt states that she is currently having bright red bleeding and slight cramping. I went ahead and scheduled the next opening for her confirmation, was not sure if we could see her on a Friday spot for [place]. Please Advise.

## 2021-09-07 ENCOUNTER — Emergency Department
Admission: EM | Admit: 2021-09-07 | Discharge: 2021-09-07 | Disposition: A | Discharge status: Home / Self Care | Payer: BC Managed Care – PPO | Attending: Emergency Medicine | Admitting: Emergency Medicine

## 2021-09-07 ENCOUNTER — Other Ambulatory Visit: Payer: Self-pay | Admitting: Certified Nurse Midwife

## 2021-09-07 DIAGNOSIS — R58 Hemorrhage, not elsewhere classified: Secondary | ICD-10-CM

## 2021-09-07 DIAGNOSIS — O200 Threatened abortion: Secondary | ICD-10-CM

## 2021-09-07 NOTE — Telephone Encounter (Signed)
Informed pt I have received her message and that I will forward to AT to please advise.

## 2021-09-07 NOTE — Telephone Encounter (Signed)
Patient called and stated that she went to the ED last night because he symptoms became worse. She stated that they did blood work and a vaginal [place].  She waited for several hours and ended up leaving at 2 something.  Patient is asking if we could look at those results and if someone would please call her

## 2021-09-08 ENCOUNTER — Other Ambulatory Visit: Payer: BC Managed Care – PPO

## 2021-09-09 ENCOUNTER — Other Ambulatory Visit: Payer: BC Managed Care – PPO

## 2021-09-09 ENCOUNTER — Other Ambulatory Visit: Payer: Self-pay

## 2021-09-09 DIAGNOSIS — O200 Threatened abortion: Secondary | ICD-10-CM

## 2021-09-09 NOTE — Telephone Encounter (Signed)
fyi

## 2021-09-10 ENCOUNTER — Other Ambulatory Visit: Payer: Self-pay | Admitting: Certified Nurse Midwife

## 2021-09-10 DIAGNOSIS — O039 Complete or unspecified spontaneous abortion without complication: Secondary | ICD-10-CM

## 2021-09-10 LAB — BETA HCG QUANT (REF LAB): hCG Quant: 17 m[IU]/mL

## 2021-09-13 ENCOUNTER — Ambulatory Visit (INDEPENDENT_AMBULATORY_CARE_PROVIDER_SITE_OTHER): Payer: BC Managed Care – PPO

## 2021-09-13 ENCOUNTER — Other Ambulatory Visit: Payer: Self-pay

## 2021-09-13 DIAGNOSIS — O200 Threatened abortion: Secondary | ICD-10-CM

## 2021-09-15 ENCOUNTER — Other Ambulatory Visit: Payer: BC Managed Care – PPO

## 2021-09-15 ENCOUNTER — Other Ambulatory Visit: Payer: Self-pay

## 2021-09-15 DIAGNOSIS — O039 Complete or unspecified spontaneous abortion without complication: Secondary | ICD-10-CM

## 2021-09-16 LAB — BETA HCG QUANT (REF LAB): hCG Quant: 2 m[IU]/mL

## 2021-10-04 ENCOUNTER — Encounter: Payer: BC Managed Care – PPO | Admitting: Certified Nurse Midwife

## 2021-10-04 ENCOUNTER — Encounter: Payer: Self-pay | Admitting: Certified Nurse Midwife

## 2021-11-02 ENCOUNTER — Other Ambulatory Visit: Payer: Self-pay

## 2021-11-02 ENCOUNTER — Ambulatory Visit (INDEPENDENT_AMBULATORY_CARE_PROVIDER_SITE_OTHER): Payer: Self-pay | Admitting: Certified Nurse Midwife

## 2021-11-02 ENCOUNTER — Encounter: Payer: Self-pay | Admitting: Certified Nurse Midwife

## 2021-11-02 VITALS — BP 127/78 | HR 73 | Ht 64.0 in | Wt 259.7 lb

## 2021-11-02 DIAGNOSIS — Z3200 Encounter for pregnancy test, result unknown: Secondary | ICD-10-CM

## 2021-11-02 LAB — POCT URINE PREGNANCY: Preg Test, Ur: POSITIVE — AB

## 2021-11-02 MED ORDER — DOXYLAMINE-PYRIDOXINE 10-10 MG PO TBEC: 1.0000 | DELAYED_RELEASE_TABLET | Freq: Four times a day (QID) | ORAL | 5 refills | Status: DC

## 2021-11-02 NOTE — Patient Instructions (Signed)
Prenatal Care ?Prenatal care is health care during pregnancy. It helps you and your unborn baby (fetus) stay as healthy as possible. Prenatal care may be provided by a midwife, a family practice doctor, a mid-level practitioner (nurse practitioner or physician assistant), or a childbirth and pregnancy doctor (obstetrician). ?How does this affect me? ?During pregnancy, you will be closely monitored for any new conditions that might develop. To lower your risk of pregnancy complications, you and your health care provider will talk about any underlying conditions you have. ?How does this affect my baby? ?Early and consistent prenatal care increases the chance that your baby will be healthy during pregnancy. Prenatal care lowers the risk that your baby will be: ?Born early (prematurely). ?Smaller than expected at birth (small for gestational age). ?What can I expect at the first prenatal care visit? ?Your first prenatal care visit will likely be the longest. You should schedule your first prenatal care visit as soon as you know that you are pregnant. Your first visit is a good time to talk about any questions or concerns you have about pregnancy. ?Medical history ?At your visit, you and your health care provider will talk about your medical history, including: ?Any past pregnancies. ?Your family's medical history. ?Medical history of the baby's father. ?Any long-term (chronic) health conditions you have and how you manage them. ?Any surgeries or procedures you have had. ?Any current over-the-counter or prescription medicines, herbs, or supplements that you are taking. ?Other factors that could pose a risk to your baby, including: ?Exposure to harmful chemicals or radiation at work or at home. ?Any substance use, including tobacco, alcohol, and drug use. ?Your home setting and your stress levels, including: ?Exposure to abuse or violence. ?Household financial strain. ?Your daily health habits, including diet and  exercise. ?Tests and screenings ?Your health care provider will: ?Measure your weight, height, and blood pressure. ?Do a physical exam, including a pelvic and breast exam. ?Perform blood tests and urine tests to check for: ?Urinary tract infection. ?Sexually transmitted infections (STIs). ?Low iron levels in your blood (anemia). ?Blood type and certain proteins on red blood cells (Rh antibodies). ?Infections and immunity to viruses, such as hepatitis B and rubella. ?HIV (human immunodeficiency virus). ?Discuss your options for genetic screening. ?Tips about staying healthy ?Your health care provider will also give you information about how to keep yourself and your baby healthy, including: ?Nutrition and taking vitamins. ?Physical activity. ?How to manage pregnancy symptoms such as nausea and vomiting (morning sickness). ?Infections and substances that may be harmful to your baby and how to avoid them. ?Food safety. ?Dental care. ?Working. ?Travel. ?Warning signs to watch for and when to call your health care provider. ?How often will I have prenatal care visits? ?After your first prenatal care visit, you will have regular visits throughout your pregnancy. The visit schedule is often as follows: ?Up to week 28 of pregnancy: once every 4 weeks. ?28-36 weeks: once every 2 weeks. ?After 36 weeks: every week until delivery. ?Some women may have visits more or less often depending on any underlying health conditions and the health of the baby. ?Keep all follow-up and prenatal care visits. This is important. ?What happens during routine prenatal care visits? ?Your health care provider will: ?Measure your weight and blood pressure. ?Check for fetal heart sounds. ?Measure the height of your uterus in your abdomen (fundal height). This may be measured starting around week 20 of pregnancy. ?Check the position of your baby inside your uterus. ?Ask questions   about your diet, sleeping patterns, and whether you can feel the baby  move. ?Review warning signs to watch for and signs of labor. ?Ask about any pregnancy symptoms you are having and how you are dealing with them. Symptoms may include: ?Headaches. ?Nausea and vomiting. ?Vaginal discharge. ?Swelling. ?Fatigue. ?Constipation. ?Changes in your vision. ?Feeling persistently sad or anxious. ?Any discomfort, including back or pelvic pain. ?Bleeding or spotting. ?Make a list of questions to ask your health care provider at your routine visits. ?What tests might I have during prenatal care visits? ?You may have blood, urine, and imaging tests throughout your pregnancy, such as: ?Urine tests to check for glucose, protein, or signs of infection. ?Glucose tests to check for a form of diabetes that can develop during pregnancy (gestational diabetes mellitus). This is usually done around week 24 of pregnancy. ?Ultrasounds to check your baby's growth and development, to check for birth defects, and to check your baby's well-being. These can also help to decide when you should deliver your baby. ?A test to check for group B strep (GBS) infection. This is usually done around week 36 of pregnancy. ?Genetic testing. This may include blood, fluid, or tissue sampling, or imaging tests, such as an ultrasound. Some genetic tests are done during the first trimester and some are done during the second trimester. ?What else can I expect during prenatal care visits? ?Your health care provider may recommend getting certain vaccines during pregnancy. These may include: ?A yearly flu shot (annual influenza vaccine). This is especially important if you will be pregnant during flu season. ?Tdap (tetanus, diphtheria, pertussis) vaccine. Getting this vaccine during pregnancy can protect your baby from whooping cough (pertussis) after birth. This vaccine may be recommended between weeks 27 and 36 of pregnancy. ?A COVID-19 vaccine. ?Later in your pregnancy, your health care provider may give you information  about: ?Childbirth and breastfeeding classes. ?Choosing a health care provider for your baby. ?Umbilical cord banking. ?Breastfeeding. ?Birth control after your baby is born. ?The hospital labor and delivery unit and how to set up a tour. ?Registering at the hospital before you go into labor. ?Where to find more information ?Office on Women's Health: womenshealth.gov ?American Pregnancy Association: americanpregnancy.org ?March of Dimes: marchofdimes.org ?Summary ?Prenatal care helps you and your baby stay as healthy as possible during pregnancy. ?Your first prenatal care visit will most likely be the longest. ?You will have visits and tests throughout your pregnancy to monitor your health and your baby's health. ?Bring a list of questions to your visits to ask your health care provider. ?Make sure to keep all follow-up and prenatal care visits. ?This information is not intended to replace advice given to you by your health care provider. Make sure you discuss any questions you have with your health care provider. ?Document Revised: 09/17/2020 Document Reviewed: 09/17/2020 ?Elsevier Patient Education ? 2022 [employer] ? ?

## 2021-11-02 NOTE — Progress Notes (Signed)
Subjective:    Brianna Gilmore is a 25 y.o. female who presents for evaluation of amenorrhea. She believes she could be pregnant. Pregnancy is desired. Sexual Activity: single partner, contraception: none. Current symptoms also include: breast tenderness and fatigue. Last period was normal.   Patient's last menstrual period was 07/26/2021 (exact date). The following portions of the patient's history were reviewed and updated as appropriate: allergies, current medications, past family history, past medical history, past social history, past surgical history, and problem list.  Review of Systems Pertinent items are noted in HPI.     Objective:    LMP 07/26/2021 (Exact Date)  General: alert, cooperative, appears stated age, and no acute distress    Lab Review Urine HCG: positive    Assessment:    Absence of menstruation.     Plan:    Pregnancy Test:  Positive: EDC: 06/13/22. Briefly discussed pre-natal care options. Pregnancy, Childbirth and the Newborn book given. Encouraged well-balanced diet, plenty of rest when needed, pre-natal vitamins daily and walking for exercise. Discussed self-help for nausea, avoiding OTC medications until consulting provider or pharmacist, other than Tylenol as needed, minimal caffeine (1-2 cups daily) and avoiding alcohol. She will schedule dating u/s in 2 wks, Nurse visit @ [redacted] wks pregnant , and her initial NOB visit @ [redacted] wks pregnant . Feel free to call with any questions.   Doreene Burke, CNM

## 2021-11-09 ENCOUNTER — Ambulatory Visit (INDEPENDENT_AMBULATORY_CARE_PROVIDER_SITE_OTHER): Payer: BC Managed Care – PPO

## 2021-11-09 ENCOUNTER — Other Ambulatory Visit: Payer: Self-pay

## 2021-11-09 DIAGNOSIS — Z3200 Encounter for pregnancy test, result unknown: Secondary | ICD-10-CM | POA: Diagnosis not present

## 2021-11-18 ENCOUNTER — Other Ambulatory Visit: Payer: Self-pay

## 2021-11-18 ENCOUNTER — Ambulatory Visit (INDEPENDENT_AMBULATORY_CARE_PROVIDER_SITE_OTHER): Payer: 59 | Admitting: Certified Nurse Midwife

## 2021-11-18 VITALS — BP 129/75 | HR 79 | Resp 16 | Ht 64.0 in | Wt 260.9 lb

## 2021-11-18 DIAGNOSIS — O99210 Obesity complicating pregnancy, unspecified trimester: Secondary | ICD-10-CM

## 2021-11-18 DIAGNOSIS — Z3481 Encounter for supervision of other normal pregnancy, first trimester: Secondary | ICD-10-CM | POA: Diagnosis not present

## 2021-11-18 DIAGNOSIS — Z3A10 10 weeks gestation of pregnancy: Secondary | ICD-10-CM | POA: Diagnosis not present

## 2021-11-18 DIAGNOSIS — Z113 Encounter for screening for infections with a predominantly sexual mode of transmission: Secondary | ICD-10-CM

## 2021-11-18 NOTE — Progress Notes (Signed)
Brianna Gilmore presents for NOB nurse interview visit. Pregnancy confirmation done 11/02/2021.  [id number]. Pregnancy education material explained and given. No cats in the home. NOB labs ordered. (TSH/HbgA1c due to Increased BMI. Body mass index is 44.78 kg/m. Sickle cell ordered. HIV labs and Drug screen were explained optional and she did not decline. Drug screen ordered. PNV encouraged. Genetic screening options discussed. Genetic testing: Ordered. Ok to notify her of results. Pt may discuss with provider.  Financial policy reviewed. FMLA form reviewed and signed. Pt. To follow up with Doreene Burke in 1 week for NOB physical.  All questions answered.

## 2021-11-18 NOTE — Patient Instructions (Incomplete)
Waterbirth Class  The next 4 dates are as follows:   Dec 07, 2021 (Tuesday) from 6-8 PM  Jan 06, 2022  (Thursday) from 6-8 PM  Feb 03, 2022 (Thursday) from 6-8 PM  March 03, 2022 (Thursday) from 6-8 PM     [facility] [place], [place]  Interested in a waterbirth?  This informational class will help you discover whether waterbirth is the right fit for you.  Education about waterbirth itself, supplies you would need and how to assemble your support team is what you can expect from this class.  Some obstetrical practices require this class in order to pursue a waterbirth.  (Not all obstetrical practices offer waterbirth check with your healthcare provider)  Register only the expectant mom, but you are encouraged to bring your partner to class!  Fees & Payment No fee  Register Online [email or web address] Search Waterbirth      Morning Sickness Morning sickness is when you feel like you may vomit (feel nauseous) during pregnancy. Sometimes, you may vomit. Morning sickness most often happens in the morning, but it can also happen at any time of the day. Some women may have morning sickness that makes them vomit all the time. This is a more serious problem that needs treatment. What are the causes? The cause of this condition is not known. What increases the risk? You had vomiting or a feeling like you may vomit before your pregnancy. You had morning sickness in another pregnancy. You are pregnant with more than one baby, such as twins. What are the signs or symptoms? Feeling like you may vomit. Vomiting. How is this treated? Treatment is usually not needed for this condition. You may only need to change what you eat. In some cases, your doctor may give you some things to take for your condition. These include: Vitamin B6 supplements. Medicines to treat the feeling that you may vomit. Ginger. Follow these instructions at home: Medicines Take  over-the-counter and prescription medicines only as told by your doctor. Do not take any medicines until you talk with your doctor about them first. Take multivitamins before you get pregnant. These can stop or lessen the symptoms of morning sickness. Eating and drinking Eat dry toast or crackers before getting out of bed. Eat 5 or 6 small meals a day. Eat dry and bland foods like rice and baked potatoes. Do not eat greasy, fatty, or spicy foods. Have someone cook for you if the smell of food causes you to vomit or to feel like you may vomit. If you feel like you may vomit after taking prenatal vitamins, take them at night or with a snack. Eat protein foods when you need a snack. Nuts, yogurt, and cheese are good choices. Drink fluids throughout the day. Try ginger ale made with real ginger, ginger tea made from fresh grated ginger, or ginger candies. General instructions Do not smoke or use any products that contain nicotine or tobacco. If you need help quitting, ask your doctor. Use an air purifier to keep the air in your house free of smells. Get lots of fresh air. Try to avoid smells that make you feel sick. Try wearing an acupressure wristband. This is a wristband that is used to treat seasickness. Try a treatment called acupuncture. In this treatment, a doctor puts needles into certain areas of your body to make you feel better. Contact a doctor if: You need medicine to feel better. You feel dizzy or light-headed. You are losing weight. Get  help right away if: The feeling that you may vomit will not go away, or you cannot stop vomiting. You faint. You have very bad pain in your belly. Summary Morning sickness is when you feel like you may vomit (feel nauseous) during pregnancy. You may feel sick in the morning, but you can feel this way at any time of the day. Making some changes to what you eat may help your symptoms go away. This information is not intended to replace advice  given to you by your health care provider. Make sure you discuss any questions you have with your health care provider. Document Revised: 07/20/2020 Document Reviewed: 06/29/2020 Elsevier Patient Education  2022 [employer]. First Trimester of Pregnancy The first trimester of pregnancy starts on the first day of your last menstrual period until the end of week 12. This is also called months 1 through 3 of pregnancy. Body changes during your first trimester Your body goes through many changes during pregnancy. The changes usually return to normal after your baby is born. Physical changes You may gain or lose weight. Your breasts may grow larger and hurt. The area around your nipples may get darker. Dark spots or blotches may develop on your face. You may have changes in your hair. Health changes You may feel like you might vomit (nauseous), and you may vomit. You may have heartburn. You may have headaches. You may have trouble pooping (constipation). Your gums may bleed. Other changes You may get tired easily. You may pee (urinate) more often. Your menstrual periods will stop. You may not feel hungry. You may want to eat certain kinds of food. You may have changes in your emotions from day to day. You may have more dreams. Follow these instructions at home: Medicines Take over-the-counter and prescription medicines only as told by your doctor. Some medicines are not safe during pregnancy. Take a prenatal vitamin that contains at least 600 micrograms (mcg) of folic acid. Eating and drinking Eat healthy meals that include: Fresh fruits and vegetables. Whole grains. Good sources of protein, such as meat, eggs, or tofu. Low-fat dairy products. Avoid raw meat and unpasteurized juice, milk, and cheese. If you feel like you may vomit, or you vomit: Eat 4 or 5 small meals a day instead of 3 large meals. Try eating a few soda crackers. Drink liquids between meals instead of during  meals. You may need to take these actions to prevent or treat trouble pooping: Drink enough fluids to keep your pee (urine) pale yellow. Eat foods that are high in fiber. These include beans, whole grains, and fresh fruits and vegetables. Limit foods that are high in fat and sugar. These include fried or sweet foods. Activity Exercise only as told by your doctor. Most people can do their usual exercise routine during pregnancy. Stop exercising if you have cramps or pain in your lower belly (abdomen) or low back. Do not exercise if it is too hot or too humid, or if you are in a place of great height (high altitude). Avoid heavy lifting. If you choose to, you may have sex unless your doctor tells you not to. Relieving pain and discomfort Wear a good support bra if your breasts are sore. Rest with your legs raised (elevated) if you have leg cramps or low back pain. If you have bulging veins (varicose veins) in your legs: Wear support hose as told by your doctor. Raise your feet for 15 minutes, 3-4 times a day. Limit salt in  your food. Safety Wear your seat belt at all times when you are in a car. Talk with your doctor if someone is hurting you or yelling at you. Talk with your doctor if you are feeling sad or have thoughts of hurting yourself. Lifestyle Do not use hot tubs, steam rooms, or saunas. Do not douche. Do not use tampons or scented sanitary pads. Do not use herbal medicines, illegal drugs, or medicines that are not approved by your doctor. Do not drink alcohol. Do not smoke or use any products that contain nicotine or tobacco. If you need help quitting, ask your doctor. Avoid cat litter boxes and soil that is used by cats. These carry germs that can cause harm to the baby and can cause a loss of your baby by miscarriage or stillbirth. General instructions Keep all follow-up visits. This is important. Ask for help if you need counseling or if you need help with nutrition. Your  doctor can give you advice or tell you where to go for help. Visit your dentist. At home, brush your teeth with a soft toothbrush. Floss gently. Write down your questions. Take them to your prenatal visits. Where to find more information American Pregnancy Association: americanpregnancy.org [employer]ians [employer]necologists: www.acog.org Office on Women's Health: MightyReward.co.nz Contact a doctor if: You are dizzy. You have a fever. You have mild cramps or pressure in your lower belly. You have a nagging pain in your belly area. You continue to feel like you may vomit, you vomit, or you have watery poop (diarrhea) for 24 hours or longer. You have a bad-smelling fluid coming from your vagina. You have pain when you pee. You are exposed to a disease that spreads from person to person, such as chickenpox, measles, Zika virus, HIV, or hepatitis. Get help right away if: You have spotting or bleeding from your vagina. You have very bad belly cramping or pain. You have shortness of breath or chest pain. You have any kind of injury, such as from a fall or a car crash. You have new or increased pain, swelling, or redness in an arm or leg. Summary The first trimester of pregnancy starts on the first day of your last menstrual period until the end of week 12 (months 1 through 3). Eat 4 or 5 small meals a day instead of 3 large meals. Do not smoke or use any products that contain nicotine or tobacco. If you need help quitting, ask your doctor. Keep all follow-up visits. This information is not intended to replace advice given to you by your health care provider. Make sure you discuss any questions you have with your health care provider. Document Revised: 05/13/2020 Document Reviewed: 03/19/2020 Elsevier Patient Education  2022 Elsevier Inc. Commonly Asked Questions During Pregnancy  Cats: A parasite can be excreted in cat feces.  To avoid exposure you need to have  another person empty the little box.  If you must empty the litter box you will need to wear gloves.  Wash your hands after handling your cat.  This parasite can also be found in raw or undercooked meat so this should also be avoided.  Colds, Sore Throats, Flu: Please check your medication sheet to see what you can take for symptoms.  If your symptoms are unrelieved by these medications please call the office.  Dental Work: Most any dental work Agricultural consultant recommends is permitted.  X-rays should only be taken during the first trimester if absolutely necessary.  Your abdomen  should be shielded with a lead apron during all x-rays.  Please notify your provider prior to receiving any x-rays.  Novocaine is fine; gas is not recommended.  If your dentist requires a note from [place] prior to dental work please call the office and we will provide one for you.  Exercise: Exercise is an important part of staying healthy during your pregnancy.  You may continue most exercises you were accustomed to prior to pregnancy.  Later in your pregnancy you will most likely notice you have difficulty with activities requiring balance like riding a bicycle.  It is important that you listen to your body and avoid activities that put you at a higher risk of falling.  Adequate rest and staying well hydrated are a must!  If you have questions about the safety of specific activities ask your provider.    Exposure to Children with illness: Try to avoid obvious exposure; report any symptoms to [place] when noted,  If you have chicken pos, red measles or mumps, you should be immune to these diseases.   Please do not take any vaccines while pregnant unless you have checked with your OB provider.  Fetal Movement: After 28 weeks we recommend you do "kick counts" twice daily.  Lie or sit down in a calm quiet environment and count your baby movements "kicks".  You should feel your baby at least 10 times per hour.  If you have not felt 10 kicks within  the first hour get up, walk around and have something sweet to eat or drink then repeat for an additional hour.  If count remains less than 10 per hour notify your provider.  Fumigating: Follow your pest control agent's advice as to how long to stay out of your home.  Ventilate the area well before re-entering.  Hemorrhoids:   Most over-the-counter preparations can be used during pregnancy.  Check your medication to see what is safe to use.  It is important to use a stool softener or fiber in your diet and to drink lots of liquids.  If hemorrhoids seem to be getting worse please call the office.   Hot Tubs:  Hot tubs Jacuzzis and saunas are not recommended while pregnant.  These increase your internal body temperature and should be avoided.  Intercourse:  Sexual intercourse is safe during pregnancy as long as you are comfortable, unless otherwise advised by your provider.  Spotting may occur after intercourse; report any bright red bleeding that is heavier than spotting.  Labor:  If you know that you are in labor, please go to the hospital.  If you are unsure, please call the office and let us help you decide what to do.  Lifting, straining, etc:  If your job requires heavy lifting or straining please check with your provider for any limitations.  Generally, you should not lift items heavier than that you can lift simply with your hands and arms (no back muscles)  Painting:  Paint fumes do not harm your pregnancy, but may make you ill and should be avoided if possible.  Latex or water based paints have less odor than oils.  Use adequate ventilation while painting.  Permanents & Hair Color:  Chemicals in hair dyes are not recommended as they cause increase hair dryness which can increase hair loss during pregnancy.  " Highlighting" and permanents are allowed.  Dye may be absorbed differently and permanents may not hold as well during pregnancy.  Sunbathing:  Use a sunscreen, as skin burns  easily  during pregnancy.  Drink plenty of fluids; avoid over heating.  Tanning Beds:  Because their possible side effects are still unknown, tanning beds are not recommended.  Ultrasound Scans:  Routine ultrasounds are performed at approximately 20 weeks.  You will be able to see your baby's general anatomy an if you would like to know the gender this can usually be determined as well.  If it is questionable when you conceived you may also receive an ultrasound early in your pregnancy for dating purposes.  Otherwise ultrasound exams are not routinely performed unless there is a medical necessity.  Although you can request a scan we ask that you pay for it when conducted because insurance does not cover " patient request" scans.  Work: If your pregnancy proceeds without complications you may work until your due date, unless your physician or employer advises otherwise.  Round Ligament Pain/Pelvic Discomfort:  Sharp, shooting pains not associated with bleeding are fairly common, usually occurring in the second trimester of pregnancy.  They tend to be worse when standing up or when you remain standing for long periods of time.  These are the result of pressure of certain pelvic ligaments called "round ligaments".  Rest, Tylenol and heat seem to be the most effective relief.  As the womb and fetus grow, they rise out of the pelvis and the discomfort improves.  Please notify the office if your pain seems different than that described.  It may represent a more serious condition.

## 2021-11-19 LAB — PAIN MGT SCRN (14 DRUGS), UR
Amphetamine Scrn, Ur: NEGATIVE ng/mL
BARBITURATE SCREEN URINE: NEGATIVE ng/mL
BENZODIAZEPINE SCREEN, URINE: NEGATIVE ng/mL
Buprenorphine, Urine: NEGATIVE ng/mL
CANNABINOIDS UR QL SCN: POSITIVE ng/mL — AB
Cocaine (Metab) Scrn, Ur: NEGATIVE ng/mL
Creatinine(Crt), U: 211.2 mg/dL (ref 20.0–300.0)
Fentanyl, Urine: NEGATIVE pg/mL
Meperidine Screen, Urine: NEGATIVE ng/mL
Methadone Screen, Urine: NEGATIVE ng/mL
OXYCODONE+OXYMORPHONE UR QL SCN: NEGATIVE ng/mL
Opiate Scrn, Ur: NEGATIVE ng/mL
Ph of Urine: 5.7 (ref 4.5–8.9)
Phencyclidine Qn, Ur: NEGATIVE ng/mL
Propoxyphene Scrn, Ur: NEGATIVE ng/mL
Tramadol Screen, Urine: NEGATIVE ng/mL

## 2021-11-19 LAB — URINALYSIS, ROUTINE W REFLEX MICROSCOPIC

## 2021-11-20 LAB — URINE CULTURE, OB REFLEX

## 2021-11-20 LAB — CULTURE, OB URINE

## 2021-11-21 LAB — RUBELLA SCREEN: Rubella Antibodies, IGG: 12.4 index (ref >0.99)

## 2021-11-21 LAB — PARVOVIRUS B19 ANTIBODY, IGG AND IGM
Parvovirus B19 IgG: 0.7 index (ref 0.0–0.8)
Parvovirus B19 IgM: 0.2 index (ref 0.0–0.8)

## 2021-11-21 LAB — VIRAL HEPATITIS HBV, HCV
HCV Ab: <0.1 s/co ratio (ref 0.0–0.9)
Hep B Core Total Ab: NEGATIVE
Hep B Surface Ab, Qual: REACTIVE
Hepatitis B Surface Ag: NEGATIVE

## 2021-11-21 LAB — GC/CHLAMYDIA PROBE AMP
Chlamydia trachomatis, NAA: NEGATIVE
Neisseria Gonorrhoeae by PCR: NEGATIVE

## 2021-11-21 LAB — VARICELLA ZOSTER ANTIBODY, IGG: Varicella zoster IgG: 2375 index (ref >165)

## 2021-11-21 LAB — HIV ANTIBODY (ROUTINE TESTING W REFLEX): HIV Screen 4th Generation wRfx: NONREACTIVE

## 2021-11-21 LAB — TSH: TSH: 2.66 u[IU]/mL (ref 0.450–4.500)

## 2021-11-21 LAB — HEMOGLOBIN A1C
Est. average glucose Bld gHb Est-mCnc: 111 mg/dL
Hgb A1c MFr Bld: 5.5 % (ref 4.8–5.6)

## 2021-11-21 LAB — RPR: RPR Ser Ql: NONREACTIVE

## 2021-11-21 LAB — HCV INTERPRETATION

## 2021-11-21 LAB — ANTIBODY SCREEN: Antibody Screen: NEGATIVE

## 2021-11-21 LAB — HGB SOLU + RFLX FRAC: Sickle Solubility Test - HGBRFX: NEGATIVE

## 2021-11-21 LAB — ABO AND RH: Rh Factor: POSITIVE

## 2021-11-26 ENCOUNTER — Other Ambulatory Visit: Payer: Self-pay

## 2021-11-26 ENCOUNTER — Encounter: Payer: Self-pay | Admitting: Certified Nurse Midwife

## 2021-11-26 ENCOUNTER — Ambulatory Visit (INDEPENDENT_AMBULATORY_CARE_PROVIDER_SITE_OTHER): Payer: 59 | Admitting: Certified Nurse Midwife

## 2021-11-26 VITALS — BP 106/67 | HR 85 | Wt 262.0 lb

## 2021-11-26 DIAGNOSIS — Z3481 Encounter for supervision of other normal pregnancy, first trimester: Secondary | ICD-10-CM

## 2021-11-26 DIAGNOSIS — Z3A11 11 weeks gestation of pregnancy: Secondary | ICD-10-CM | POA: Diagnosis not present

## 2021-11-26 LAB — POCT URINALYSIS DIPSTICK OB
Bilirubin, UA: NEGATIVE
Blood, UA: NEGATIVE
Glucose, UA: NEGATIVE
Ketones, UA: NEGATIVE
Leukocytes, UA: NEGATIVE
Nitrite, UA: NEGATIVE
POC,PROTEIN,UA: NEGATIVE
Urobilinogen, UA: 0.2 E.U./dL
pH, UA: 5 (ref 5.0–8.0)

## 2021-11-26 MED ORDER — ASPIRIN EC 81 MG PO TBEC: 81.0000 mg | DELAYED_RELEASE_TABLET | Freq: Every day | ORAL | 11 refills | Status: DC

## 2021-11-26 NOTE — Patient Instructions (Signed)
Prenatal Care ?Prenatal care is health care during pregnancy. It helps you and your unborn baby (fetus) stay as healthy as possible. Prenatal care may be provided by a midwife, a family practice doctor, a mid-level practitioner (nurse practitioner or physician assistant), or a childbirth and pregnancy doctor (obstetrician). ?How does this affect me? ?During pregnancy, you will be closely monitored for any new conditions that might develop. To lower your risk of pregnancy complications, you and your health care provider will talk about any underlying conditions you have. ?How does this affect my baby? ?Early and consistent prenatal care increases the chance that your baby will be healthy during pregnancy. Prenatal care lowers the risk that your baby will be: ?Born early (prematurely). ?Smaller than expected at birth (small for gestational age). ?What can I expect at the first prenatal care visit? ?Your first prenatal care visit will likely be the longest. You should schedule your first prenatal care visit as soon as you know that you are pregnant. Your first visit is a good time to talk about any questions or concerns you have about pregnancy. ?Medical history ?At your visit, you and your health care provider will talk about your medical history, including: ?Any past pregnancies. ?Your family's medical history. ?Medical history of the baby's father. ?Any long-term (chronic) health conditions you have and how you manage them. ?Any surgeries or procedures you have had. ?Any current over-the-counter or prescription medicines, herbs, or supplements that you are taking. ?Other factors that could pose a risk to your baby, including: ?Exposure to harmful chemicals or radiation at work or at home. ?Any substance use, including tobacco, alcohol, and drug use. ?Your home setting and your stress levels, including: ?Exposure to abuse or violence. ?Household financial strain. ?Your daily health habits, including diet and  exercise. ?Tests and screenings ?Your health care provider will: ?Measure your weight, height, and blood pressure. ?Do a physical exam, including a pelvic and breast exam. ?Perform blood tests and urine tests to check for: ?Urinary tract infection. ?Sexually transmitted infections (STIs). ?Low iron levels in your blood (anemia). ?Blood type and certain proteins on red blood cells (Rh antibodies). ?Infections and immunity to viruses, such as hepatitis B and rubella. ?HIV (human immunodeficiency virus). ?Discuss your options for genetic screening. ?Tips about staying healthy ?Your health care provider will also give you information about how to keep yourself and your baby healthy, including: ?Nutrition and taking vitamins. ?Physical activity. ?How to manage pregnancy symptoms such as nausea and vomiting (morning sickness). ?Infections and substances that may be harmful to your baby and how to avoid them. ?Food safety. ?Dental care. ?Working. ?Travel. ?Warning signs to watch for and when to call your health care provider. ?How often will I have prenatal care visits? ?After your first prenatal care visit, you will have regular visits throughout your pregnancy. The visit schedule is often as follows: ?Up to week 28 of pregnancy: once every 4 weeks. ?28-36 weeks: once every 2 weeks. ?After 36 weeks: every week until delivery. ?Some women may have visits more or less often depending on any underlying health conditions and the health of the baby. ?Keep all follow-up and prenatal care visits. This is important. ?What happens during routine prenatal care visits? ?Your health care provider will: ?Measure your weight and blood pressure. ?Check for fetal heart sounds. ?Measure the height of your uterus in your abdomen (fundal height). This may be measured starting around week 20 of pregnancy. ?Check the position of your baby inside your uterus. ?Ask questions   about your diet, sleeping patterns, and whether you can feel the baby  move. ?Review warning signs to watch for and signs of labor. ?Ask about any pregnancy symptoms you are having and how you are dealing with them. Symptoms may include: ?Headaches. ?Nausea and vomiting. ?Vaginal discharge. ?Swelling. ?Fatigue. ?Constipation. ?Changes in your vision. ?Feeling persistently sad or anxious. ?Any discomfort, including back or pelvic pain. ?Bleeding or spotting. ?Make a list of questions to ask your health care provider at your routine visits. ?What tests might I have during prenatal care visits? ?You may have blood, urine, and imaging tests throughout your pregnancy, such as: ?Urine tests to check for glucose, protein, or signs of infection. ?Glucose tests to check for a form of diabetes that can develop during pregnancy (gestational diabetes mellitus). This is usually done around week 24 of pregnancy. ?Ultrasounds to check your baby's growth and development, to check for birth defects, and to check your baby's well-being. These can also help to decide when you should deliver your baby. ?A test to check for group B strep (GBS) infection. This is usually done around week 36 of pregnancy. ?Genetic testing. This may include blood, fluid, or tissue sampling, or imaging tests, such as an ultrasound. Some genetic tests are done during the first trimester and some are done during the second trimester. ?What else can I expect during prenatal care visits? ?Your health care provider may recommend getting certain vaccines during pregnancy. These may include: ?A yearly flu shot (annual influenza vaccine). This is especially important if you will be pregnant during flu season. ?Tdap (tetanus, diphtheria, pertussis) vaccine. Getting this vaccine during pregnancy can protect your baby from whooping cough (pertussis) after birth. This vaccine may be recommended between weeks 27 and 36 of pregnancy. ?A COVID-19 vaccine. ?Later in your pregnancy, your health care provider may give you information  about: ?Childbirth and breastfeeding classes. ?Choosing a health care provider for your baby. ?Umbilical cord banking. ?Breastfeeding. ?Birth control after your baby is born. ?The hospital labor and delivery unit and how to set up a tour. ?Registering at the hospital before you go into labor. ?Where to find more information ?Office on Women's Health: womenshealth.gov ?American Pregnancy Association: americanpregnancy.org ?March of Dimes: marchofdimes.org ?Summary ?Prenatal care helps you and your baby stay as healthy as possible during pregnancy. ?Your first prenatal care visit will most likely be the longest. ?You will have visits and tests throughout your pregnancy to monitor your health and your baby's health. ?Bring a list of questions to your visits to ask your health care provider. ?Make sure to keep all follow-up and prenatal care visits. ?This information is not intended to replace advice given to you by your health care provider. Make sure you discuss any questions you have with your health care provider. ?Document Revised: 09/17/2020 Document Reviewed: 09/17/2020 ?Elsevier Patient Education ? 2022 [employer] ? ?

## 2021-11-26 NOTE — Progress Notes (Signed)
NEW OB HISTORY AND PHYSICAL  SUBJECTIVE:       Brianna Gilmore is a 25 y.o. G2P0010 female, Patient's last menstrual period was 09/06/2021 (exact date)., Estimated Date of Delivery: 06/13/22, [redacted]w[redacted]d, presents today for establishment of Prenatal Care. She has no unusual complaints and complains of nausea with vomiting which she is managing without medicine.  Social Relationship: female partner. 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 Normal Contraception: none Last Pap: 2021 per pt. Results were: normal per pt   Obstetric History OB History  Gravida Para Term Preterm AB Living  2       1    SAB IAB Ectopic Multiple Live Births  1            # Outcome Date GA Lbr Len/2nd Weight Sex Delivery Anes PTL Lv  2 Current           1 SAB             No past medical history on file.  No past surgical history on file.  Current Outpatient Medications on File Prior to Visit  Medication Sig Dispense Refill   Prenatal Vit-Fe Fumarate-FA (PRENATAL PO) Take by mouth.     Doxylamine-Pyridoxine 10-10 MG TBEC Take 1 tablet by mouth 4 (four) times daily. Day 1 &2: 2 tablet at bedtimeDay 3 : if symptoms persists 1 tablet am; 2 tablet at bedtimeDay 4: 1 tablet am, 1 tab afternoon, 2 tab at bedtime (Patient not taking: Reported on 11/26/2021) 120 tablet 5   No current facility-administered medications on file prior to visit.    Allergies  Allergen Reactions   Zithromax [Azithromycin] Hives   Penicillins Rash    Social History   Socioeconomic History   Marital status: Single    Spouse name: Not on file   Number of children: Not on file   Years of education: Not on file   Highest education level: Not on file  Occupational History   Not on file  Tobacco Use   Smoking status: Never   Smokeless  tobacco: Never  Substance and Sexual Activity   Alcohol use: No   Drug use: Not Currently   Sexual activity: Yes    Birth control/protection: None  Other Topics Concern   Not on file  Social History Narrative   Not on file   Social Determinants of Health   Financial Resource Strain: Not on file  Food Insecurity: Not on file  Transportation Needs: Not on file  Physical Activity: Not on file  Stress: Not on file  Social Connections: Not on file  Intimate Partner Violence: Not on file    Family History  Problem Relation Age of Onset   Diabetes Maternal Grandmother    Hypertension Maternal Grandmother     The following portions of the patient's history were reviewed and updated as appropriate: allergies, current medications, past OB history, past medical history, past surgical history, past family history, past social history, and problem list.    OBJECTIVE: Initial Physical Exam (New OB)  GENERAL APPEARANCE: alert, well appearing, in no apparent distress, oriented to person, place and time HEAD: normocephalic, atraumatic MOUTH: mucous membranes moist, pharynx normal without lesions THYROID: no thyromegaly or masses present BREASTS: no masses noted, no significant tenderness, no palpable axillary nodes, no skin changes LUNGS: clear to auscultation, no wheezes,  rales or rhonchi, symmetric air entry HEART: regular rate and rhythm, no murmurs ABDOMEN: soft, nontender, nondistended, no abnormal masses, no epigastric pain, obese, fundus not palpable, and FHT present EXTREMITIES: no redness or tenderness in the calves or thighs, no edema SKIN: normal coloration and turgor, no rashes LYMPH NODES: no adenopathy palpable NEUROLOGIC: alert, oriented, normal speech, no focal findings or movement disorder noted  PELVIC EXAM VAGINA: no abnormal discharge or lesions CERVIX: no lesions or cervical motion tenderness and closed  ASSESSMENT: Normal pregnancy  PLAN:  New OB  counseling: The patient has been given an overview regarding routine prenatal care. Recommendations regarding diet, weight gain, and exercise in pregnancy were given. Prenatal testing, optional genetic testing, carrier screening, and ultrasound use in pregnancy were reviewed.  Benefits of Breast Feeding were discussed. The patient is encouraged to consider nursing her baby post partum.  Recommended low-dose aspirin starting at 14 weeks. CBC and Panorama today. RTC in 4 weeks.  Doreene Burke, CNM

## 2021-11-27 LAB — CBC
Hematocrit: 38.6 % (ref 34.0–46.6)
Hemoglobin: 13.5 g/dL (ref 11.1–15.9)
MCH: 30 pg (ref 26.6–33.0)
MCHC: 35 g/dL (ref 31.5–35.7)
MCV: 86 fL (ref 79–97)
Platelets: 361 10*3/uL (ref 150–450)
RBC: 4.5 x10E6/uL (ref 3.77–5.28)
RDW: 13.1 % (ref 11.7–15.4)
WBC: 15.9 10*3/uL — ABNORMAL HIGH (ref 3.4–10.8)

## 2021-12-22 ENCOUNTER — Telehealth: Payer: Self-pay | Admitting: Certified Nurse Midwife

## 2021-12-22 NOTE — Telephone Encounter (Signed)
Pt called stating that she had bright health insurance during the month of December- she is asking if her two apts were billed to her insurance. I did not see any bright health card on file- I obtained member id # : [id number]. Pt states insurance did exp 12-18-2021, pt is now self pay pt.

## 2021-12-24 ENCOUNTER — Ambulatory Visit (INDEPENDENT_AMBULATORY_CARE_PROVIDER_SITE_OTHER): Payer: Self-pay | Admitting: Obstetrics

## 2021-12-24 ENCOUNTER — Other Ambulatory Visit: Payer: Self-pay

## 2021-12-24 VITALS — BP 119/83 | HR 78 | Wt 266.5 lb

## 2021-12-24 DIAGNOSIS — Z3482 Encounter for supervision of other normal pregnancy, second trimester: Secondary | ICD-10-CM

## 2021-12-24 DIAGNOSIS — Z3A15 15 weeks gestation of pregnancy: Secondary | ICD-10-CM

## 2021-12-24 LAB — POCT URINALYSIS DIPSTICK OB
Bilirubin, UA: NEGATIVE
Blood, UA: NEGATIVE
Glucose, UA: NEGATIVE
Ketones, UA: NEGATIVE
Leukocytes, UA: NEGATIVE
Nitrite, UA: NEGATIVE
POC,PROTEIN,UA: NEGATIVE
Spec Grav, UA: 1.015 (ref 1.010–1.025)
Urobilinogen, UA: 0.2 E.U./dL
pH, UA: 6 (ref 5.0–8.0)

## 2021-12-24 NOTE — Progress Notes (Signed)
ROB at [redacted]w[redacted]d. Feels well. No fetal movement yet. Occasional indigestion. Discussed relief measures. Encouraged 30 min walking daily. Denies MJ use. Plans to start taking low-dose ASA at 16 weeks. Awaiting Natera results. Reviewed normal discomforts of pregnancy and second trimester changes. RTC in 4 weeks for ROB and [place].  Guadlupe Spanish, CNM

## 2021-12-28 ENCOUNTER — Other Ambulatory Visit: Payer: Self-pay

## 2021-12-28 ENCOUNTER — Other Ambulatory Visit
Admission: RE | Admit: 2021-12-28 | Discharge: 2021-12-28 | Disposition: A | Discharge status: Home / Self Care | Payer: PRIVATE HEALTH INSURANCE | Source: Ambulatory Visit | Attending: Anesthesiology | Admitting: Anesthesiology

## 2021-12-28 NOTE — Consult Note (Signed)
Anesthesiology consult note ( Ambulatory referral to OB anesthesia for morbid obesity) :  I had the pleasure of meeting Brianna Gilmore today for an OB anesthesia precheck.  She has a history of morbid obesity with a BMI today of 45.  She G1P0 and is [redacted]w[redacted]d.  Patient reports no previous problems with anesthesia, no problems with her back and has a MP score of 2.  We discussed the risks of Obesity in Pregnancy including but not limited to: There is an increased risk of airway complications, including inability to place a breathing tube, should an emergency cesarean delivery be required. There is an increased risk of aspiration, where stomach contents get into the lungs and can cause inflammation, infection and even respiratory failure. Epidural placement is more difficult in obesity, often requires multiple attempts, more often results in inadequate pain relief, more often requires replacement due to movement of the epidural catheter and more often results in accidental dural puncture and post dural puncture headache.  I explained to the patient that we would follow our Obesity in Pregnancy Protocol: An anesthesiology consultation is recommended for all patients with a pre-pregnancy BMI ? 45. During this consultation the anesthesiologist will ask you questions about your medical history as well as do a physical exam that specifically looks for features that are predictive of complications of anesthesia.  The anesthesiologist will also review with you potential complications of anesthesia that are specifically increased due to obesity during pregnancy.  If your BMI ? 49 at your 34 week appointment you will have to be evaluated by an anesthesiologist prior to 36 weeks. This evaluation will specifically determine whether you are at high risk for complications of anesthesia. If you are found to be at high risk for complications of anesthesia your OB provider will be directed to transfer your care to an OB provider at  a hospital that has a higher maternal level of care designation  Patient voiced understanding and signed our Obesity in Pregnancy form acknowledging that we had discussed her pathway forward.

## 2022-01-25 ENCOUNTER — Other Ambulatory Visit: Payer: Self-pay

## 2022-01-25 ENCOUNTER — Ambulatory Visit: Payer: Self-pay | Admitting: Certified Nurse Midwife

## 2022-01-25 ENCOUNTER — Ambulatory Visit (INDEPENDENT_AMBULATORY_CARE_PROVIDER_SITE_OTHER): Payer: Self-pay

## 2022-01-25 VITALS — BP 130/79 | HR 72 | Wt 268.3 lb

## 2022-01-25 DIAGNOSIS — Z3482 Encounter for supervision of other normal pregnancy, second trimester: Secondary | ICD-10-CM

## 2022-01-25 DIAGNOSIS — Z3A20 20 weeks gestation of pregnancy: Secondary | ICD-10-CM

## 2022-01-25 LAB — POCT URINALYSIS DIPSTICK OB
Bilirubin, UA: NEGATIVE
Blood, UA: NEGATIVE
Glucose, UA: NEGATIVE
Ketones, UA: NEGATIVE
Leukocytes, UA: NEGATIVE
Nitrite, UA: NEGATIVE
Spec Grav, UA: 1.025 (ref 1.010–1.025)
Urobilinogen, UA: 0.2 E.U./dL
pH, UA: 6.5 (ref 5.0–8.0)

## 2022-01-25 NOTE — Telephone Encounter (Signed)
My Chart message sent

## 2022-01-25 NOTE — Progress Notes (Signed)
ROB doing well, feeling some fluttering. Anatomy u/s today. Results not available at this time. Will follow up . Discuss round ligament pain and self help measure. Pt asked about genetic results.esult reviewed.  She had anesthesia consult on 12/28/21. Follow up 4 wk with Missy for ROB.   Doreene Burke, CNM

## 2022-01-25 NOTE — Patient Instructions (Signed)
Round Ligament Pain The round ligaments are a pair of cord-like tissues that help support the uterus. They can become a source of pain during pregnancy as the ligaments soften and stretch as the baby grows. The pain usually begins in the second trimester (13-28 weeks) of pregnancy, and should only last for a few seconds when it occurs. However, the pain can come and go until the baby is delivered. The pain does not cause harm to the baby. Round ligament pain is usually a short, sharp, and pinching pain, but it can also be a dull, lingering, and aching pain. The pain is felt in the lower side of the abdomen or in the groin. It usually starts deep in the groin and moves up to the outside of the hip area. The pain may happen when you: Suddenly change position, such as quickly going from a sitting to standing position. Do physical activity. Cough or sneeze. Follow these instructions at home: Managing pain  When the pain starts, relax. Then, try any of these methods to help with the pain: Sit down. Flex your knees up to your abdomen. Lie on your side with one pillow under your abdomen and another pillow between your legs. Sit in a warm bath for 15-20 minutes or until the pain goes away. General instructions Watch your condition for any changes. Move slowly when you sit down or stand up. Stop or reduce your physical activities if they cause pain. Avoid long walks if they cause pain. Take over-the-counter and prescription medicines only as told by your health care provider. Keep all follow-up visits. This is important. Contact a health care provider if: Your pain does not go away with treatment. You feel pain in your back that you did not have before. Your medicine is not helping. You have a fever or chills. You have nausea or vomiting. You have diarrhea. You have pain when you urinate. Get help right away if: You have pain that is a rhythmic, cramping pain similar to labor pains. Labor pains  are usually 2 minutes apart, last for about 1 minute, and involve a bearing down feeling or pressure in your pelvis. You have vaginal bleeding. These symptoms may represent a serious problem that is an emergency. Do not wait to see if the symptoms will go away. Get medical help right away. Call your local emergency services (911 in the [place] Do not drive yourself to the hospital. Summary Round ligament pain is felt in the lower abdomen or groin. This pain usually begins in the second trimester (13-28 weeks) and should only last for a few seconds when it occurs. You may notice the pain when you suddenly change position, when you cough or sneeze, or during physical activity. Relaxing, flexing your knees to your abdomen, lying on one side, or taking a warm bath may help to get rid of the pain. Contact your health care provider if the pain does not go away. This information is not intended to replace advice given to you by your health care provider. Make sure you discuss any questions you have with your health care provider. Document Revised: 02/17/2021 Document Reviewed: 02/17/2021 Elsevier Patient Education  2022 [employer]  

## 2022-02-11 ENCOUNTER — Other Ambulatory Visit: Payer: Self-pay | Admitting: Obstetrics

## 2022-02-11 ENCOUNTER — Other Ambulatory Visit (INDEPENDENT_AMBULATORY_CARE_PROVIDER_SITE_OTHER): Payer: Self-pay

## 2022-02-11 ENCOUNTER — Other Ambulatory Visit: Payer: Self-pay

## 2022-02-11 ENCOUNTER — Encounter: Payer: Self-pay | Admitting: Obstetrics

## 2022-02-11 DIAGNOSIS — Z3492 Encounter for supervision of normal pregnancy, unspecified, second trimester: Secondary | ICD-10-CM

## 2022-02-22 ENCOUNTER — Encounter: Payer: Self-pay | Admitting: Obstetrics and Gynecology

## 2022-02-22 ENCOUNTER — Observation Stay
Admission: EM | Admit: 2022-02-22 | Discharge: 2022-02-22 | Disposition: A | Discharge status: Home / Self Care | Payer: PRIVATE HEALTH INSURANCE | Attending: Obstetrics | Admitting: Obstetrics

## 2022-02-22 ENCOUNTER — Other Ambulatory Visit: Payer: Self-pay

## 2022-02-22 DIAGNOSIS — O1202 Gestational edema, second trimester: Principal | ICD-10-CM | POA: Insufficient documentation

## 2022-02-22 DIAGNOSIS — M2141 Flat foot [pes planus] (acquired), right foot: Secondary | ICD-10-CM | POA: Insufficient documentation

## 2022-02-22 DIAGNOSIS — Q741 Congenital malformation of knee: Secondary | ICD-10-CM | POA: Insufficient documentation

## 2022-02-22 DIAGNOSIS — Z3A24 24 weeks gestation of pregnancy: Secondary | ICD-10-CM | POA: Insufficient documentation

## 2022-02-22 HISTORY — DX: Headache, unspecified: R51.9

## 2022-02-22 HISTORY — DX: Unspecified asthma, uncomplicated: J45.909

## 2022-02-22 NOTE — Discharge Instructions (Signed)
Comfort measures for swelling: ?Elevate legs ?Wear compression stockings ?Stand up and walk around at least every 2 hours when working ?Warm bath ?Abdominal band ?Wrist exercises ? ?Return to the hospital if you experience a headache that won't go away, changes to to your vision, pain in your upper right abdomen, contractions or cramping, vaginal bleeding, leaking of fluid from your vagina, or if the baby is not moving like normal. ?Keep your next scheduled prenatal visit. ?

## 2022-02-22 NOTE — OB Triage Note (Signed)
LABOR & DELIVERY OB TRIAGE NOTE ? ?SUBJECTIVE ? ?HPI ?Brianna Gilmore is a 26 y.o. G2P0010 at [redacted]w[redacted]d who presents to Labor & Delivery for swelling in her feet and hands. She states that she is sedentary at her job. She reports good fetal movement and denies contractions, LOF, and vaginal bleeding. She denies HA and visual changes.  ? ?OB History   ? ? Gravida  ?2  ? Para  ?   ? Term  ?   ? Preterm  ?   ? AB  ?1  ? Living  ?   ?  ? ? SAB  ?1  ? IAB  ?   ? Ectopic  ?   ? Multiple  ?   ? Live Births  ?   ?   ?  ?  ? ? ? ?OBJECTIVE ? ?BP 132/82 (BP Location: Left Arm)   Pulse 71   Temp 98.4 ?F (36.9 ?C) (Oral)   Resp 16   Ht 5\' 3"  (1.6 m)   Wt 122 kg   LMP 09/06/2021 (Exact Date)   BMI 47.65 kg/m?  ? ?FHR: ?145 ?Denies contractions ? ?+1 pitting edema of feet and ankles per RN assessment ? ?ASSESSMENT ?Impression ? ?1) Pregnancy at G2P0010, [redacted]w[redacted]d, Estimated Date of Delivery: 06/13/22 ?2) Reassuring maternal/fetal status ?3) Mild edema of extremities ? ?PLAN  ?1) Discharge home with standard labor and preeclampsia precautions ?2) Review comfort measures for swelling, including elevating legs, hydrating, compression stockings ? ?06/15/22, CNM ?

## 2022-02-22 NOTE — OB Triage Note (Signed)
Patient is a G2P0 that is [redacted]w[redacted]d that presents with complaint of swelling in the hands and feet. Patient denies any headache or vision disturbances. No RUQ pain. Patient denies any LOF or bleeding. Patient reports +FM. VSS. Patient has +1 pitting edema in LLE and LRE. Patient states that she has "tingling sensation" in her hands from time to time. No previous BP issues reported by patient, initial BP 132/82. No pain at this time reported. FHT 145.  ?

## 2022-02-22 NOTE — Progress Notes (Signed)
Patient given discharge instructions. Patient verbalizes understanding. Reviewed s/s of pre-eclampsia with patient and when to return to L&D if there are concerns. Patient to keep OB appointment for Friday and discuss further with provider. Patient ambulatory home with significant other.  ?

## 2022-02-23 ENCOUNTER — Encounter: Payer: Self-pay | Admitting: Obstetrics

## 2022-02-25 ENCOUNTER — Other Ambulatory Visit: Payer: Self-pay

## 2022-02-25 ENCOUNTER — Encounter: Payer: Self-pay | Admitting: Obstetrics

## 2022-02-25 ENCOUNTER — Ambulatory Visit (INDEPENDENT_AMBULATORY_CARE_PROVIDER_SITE_OTHER): Payer: Self-pay | Admitting: Obstetrics

## 2022-02-25 VITALS — BP 135/91 | HR 76 | Wt 289.1 lb

## 2022-02-25 DIAGNOSIS — Z3482 Encounter for supervision of other normal pregnancy, second trimester: Secondary | ICD-10-CM

## 2022-02-25 DIAGNOSIS — Z3A24 24 weeks gestation of pregnancy: Secondary | ICD-10-CM

## 2022-02-25 DIAGNOSIS — O169 Unspecified maternal hypertension, unspecified trimester: Secondary | ICD-10-CM

## 2022-02-25 LAB — POCT URINALYSIS DIPSTICK OB
Bilirubin, UA: NEGATIVE
Blood, UA: NEGATIVE
Glucose, UA: NEGATIVE
Ketones, UA: NEGATIVE
Leukocytes, UA: NEGATIVE
Nitrite, UA: NEGATIVE
POC,PROTEIN,UA: NEGATIVE
Spec Grav, UA: 1.015 (ref 1.010–1.025)
Urobilinogen, UA: 0.2 E.U./dL
pH, UA: 7.5 (ref 5.0–8.0)

## 2022-02-25 NOTE — Progress Notes (Signed)
ROB at [redacted]w[redacted]d. Feeling well. Good fetal movement. Denies contractions, LOF, vaginal bleeding.Swelling has improved some - mostly occurs when she is on her feet or sitting at her desk a long time. Discussed birth plan. She would like a water birth. BP elevated today. Repeat after visit also elevated. Denies headache, blurry vision, epigastric pain. Pre-e labs sent. Reviewed danger signs. ROB in 4 weeks. ? ?Brianna Gilmore Spanish, CNM ?

## 2022-02-26 ENCOUNTER — Encounter: Payer: Self-pay | Admitting: Obstetrics

## 2022-02-26 LAB — CBC
Hematocrit: 38.5 % (ref 34.0–46.6)
Hemoglobin: 12.8 g/dL (ref 11.1–15.9)
MCH: 28.8 pg (ref 26.6–33.0)
MCHC: 33.2 g/dL (ref 31.5–35.7)
MCV: 87 fL (ref 79–97)
Platelets: 274 10*3/uL (ref 150–450)
RBC: 4.44 x10E6/uL (ref 3.77–5.28)
RDW: 12.8 % (ref 11.7–15.4)
WBC: 14 10*3/uL — ABNORMAL HIGH (ref 3.4–10.8)

## 2022-02-26 LAB — COMPREHENSIVE METABOLIC PANEL
ALT: 25 IU/L (ref 0–32)
AST: 25 IU/L (ref 0–40)
Albumin/Globulin Ratio: 1.3 (ref 1.2–2.2)
Albumin: 3.2 g/dL — ABNORMAL LOW (ref 3.9–5.0)
Alkaline Phosphatase: 82 IU/L (ref 44–121)
BUN/Creatinine Ratio: 13 (ref 9–23)
BUN: 11 mg/dL (ref 6–20)
Bilirubin Total: <0.2 mg/dL (ref 0.0–1.2)
CO2: 20 mmol/L (ref 20–29)
Calcium: 9.3 mg/dL (ref 8.7–10.2)
Chloride: 102 mmol/L (ref 96–106)
Creatinine, Ser: 0.86 mg/dL (ref 0.57–1.00)
Globulin, Total: 2.5 g/dL (ref 1.5–4.5)
Glucose: 73 mg/dL (ref 70–99)
Potassium: 4.3 mmol/L (ref 3.5–5.2)
Sodium: 139 mmol/L (ref 134–144)
Total Protein: 5.7 g/dL — ABNORMAL LOW (ref 6.0–8.5)
eGFR: 95 mL/min/{1.73_m2} (ref >59)

## 2022-02-26 LAB — PROTEIN / CREATININE RATIO, URINE
Creatinine, Urine: 213.4 mg/dL
Protein, Ur: 47.7 mg/dL
Protein/Creat Ratio: 224 mg/g creat — ABNORMAL HIGH (ref 0–200)

## 2022-02-27 ENCOUNTER — Encounter: Payer: Self-pay | Admitting: Obstetrics

## 2022-03-21 ENCOUNTER — Ambulatory Visit (INDEPENDENT_AMBULATORY_CARE_PROVIDER_SITE_OTHER): Payer: Self-pay | Admitting: Certified Nurse Midwife

## 2022-03-21 VITALS — BP 155/110 | HR 79 | Wt 292.4 lb

## 2022-03-21 DIAGNOSIS — O139 Gestational [pregnancy-induced] hypertension without significant proteinuria, unspecified trimester: Secondary | ICD-10-CM

## 2022-03-21 DIAGNOSIS — Z3A28 28 weeks gestation of pregnancy: Secondary | ICD-10-CM

## 2022-03-21 DIAGNOSIS — Z23 Encounter for immunization: Secondary | ICD-10-CM

## 2022-03-21 DIAGNOSIS — O99213 Obesity complicating pregnancy, third trimester: Secondary | ICD-10-CM

## 2022-03-21 DIAGNOSIS — Z3483 Encounter for supervision of other normal pregnancy, third trimester: Secondary | ICD-10-CM

## 2022-03-21 LAB — POCT URINALYSIS DIPSTICK OB
Glucose, UA: NEGATIVE
Ketones, UA: NEGATIVE
Leukocytes, UA: NEGATIVE
Nitrite, UA: NEGATIVE
Spec Grav, UA: 1.02 (ref 1.010–1.025)
Urobilinogen, UA: 0.2 E.U./dL
pH, UA: 6.5 (ref 5.0–8.0)

## 2022-03-21 MED ORDER — TETANUS-DIPHTH-ACELL PERTUSSIS 5-2.5-18.5 LF-MCG/0.5 IM SUSY
0.5000 mL | PREFILLED_SYRINGE | Freq: Once | INTRAMUSCULAR | Status: AC
  Administered 2022-03-21: 0.5 mL via INTRAMUSCULAR

## 2022-03-21 MED ORDER — LABETALOL HCL 100 MG PO TABS: 100.0000 mg | ORAL_TABLET | Freq: Two times a day (BID) | ORAL | 0 refills | Status: DC

## 2022-03-21 NOTE — Addendum Note (Signed)
Addended by: Lady Deutscher on: 03/21/2022 04:28 PM ? ? Modules accepted: Orders ? ?

## 2022-03-21 NOTE — Progress Notes (Addendum)
ROB doing well. Pt had elevated BP today, discussed diagnosis gestational hypertension vs. Pre eclampsia. He had labs completed last visit but given p/c ration 224 repeat labs today. She denies severe headache ( has mild ones that go away without med's), denies visual changes, states she has had some discomfort in her right side that is alleviated with hot shower. Reflexes today 1+ bilateral , negative clonus, negative swelling. Dr. Logan Bores consulted. Pt to start labetalol 100 mg BID. She  Feels good movement. 28 wk labs today: Glucose screen/RPR/CBC/CMP/ protein creatinine ratio. Tdap, Blood transfusion consent completed, all questions answered. Ready set baby reviewed, see check list for topics covered. Sample birth plan given, will follow up in upcoming visits. Discussed birth control after delivery, information pamphlet given. MFM consult placed for growth u/s and gestational hypertension. She will follow up for BP check in 3 days, rob 2 wks ? ?Doreene Burke, CNM  ? ? ?

## 2022-03-21 NOTE — Patient Instructions (Signed)
Hypertension During Pregnancy Hypertension is also called high blood pressure. High blood pressure means that the force of the blood moving in your body is high enough to cause problems for you and your baby. Different types of high blood pressure can happen during pregnancy. The types are: High blood pressure before you got pregnant. This is called chronic hypertension.  This can continue during your pregnancy. Your doctor will want to keep checking your blood pressure. You may need medicine to control your blood pressure while you are pregnant. You will need follow-up visits after you have your baby. High blood pressure that goes up during pregnancy when it was normal before. This is called gestational hypertension. It will often get better after you have your baby, but your doctor will need to watch your blood pressure to make sure that it is getting better. You may develop high blood pressure after giving birth. This is called postpartum hypertension. This often occurs within 48 hours after childbirth but may occur up to 6 weeks after giving birth. Very high blood pressure during pregnancy is an emergency that needs treatment right away. How does this affect me? If you have high blood pressure during pregnancy, you have a higher chance of developing high blood pressure: As you get older. If you get pregnant again. In some cases, high blood pressure during pregnancy can cause: Stroke. Heart attack. Damage to the kidneys, lungs, or liver. Preeclampsia. HELLP syndrome. Seizures. Problems with the placenta. How does this affect my baby? Your baby may: Be born early. Not weigh as much as he or she should. Not handle labor well, leading to a C-section. This condition may also result in a baby's death before birth (stillbirth). What are the risks? Having high blood pressure during a past pregnancy. Being overweight. Being age 35 or older. Being pregnant for the first time. Being pregnant  with more than one baby. Becoming pregnant using fertility methods, such as IVF. Having other problems, such as diabetes or kidney disease. What can I do to lower my risk?  Keep a healthy weight. Eat a healthy diet. Follow what your doctor tells you about treating any medical problems that you had before you got pregnant. It is very important to go to all of your doctor visits. Your doctor will check your blood pressure and make sure that your pregnancy is progressing as it should. Treatment should start early if a problem is found. How is this treated? Treatment for high blood pressure during pregnancy can vary. It depends on the type of high blood pressure you have and how serious it is. If you were taking medicine for your blood pressure before you got pregnant, talk with your doctor. You may need to change the medicine during pregnancy if it is not safe for your baby. If your blood pressure goes up during pregnancy, your doctor may order medicine to treat this. If you are at risk for preeclampsia, your doctor may tell you to take a low-dose aspirin while you are pregnant. If you have very high blood pressure, you may need to stay in the hospital so you and your baby can be watched closely. You may also need to take medicine to lower your blood pressure. In some cases, if your condition gets worse, you may need to have your baby early. Follow these instructions at home: Eating and drinking  Drink enough fluid to keep your pee (urine) pale yellow. Avoid caffeine. Lifestyle Do not smoke or use any products that contain   nicotine or tobacco. If you need help quitting, ask your doctor. Do not use alcohol or drugs. Avoid stress. Rest and get plenty of sleep. Regular exercise can help. Ask your doctor what kinds of exercise are best for you. General instructions Take over-the-counter and prescription medicines only as told by your doctor. Keep all prenatal and follow-up visits. Contact a  doctor if: You have symptoms that your doctor told you to watch for, such as: Headaches. A feeling like you may vomit (nausea). Vomiting. Belly (abdominal) pain. Feeling dizzy or light-headed. Get help right away if: You have symptoms of serious problems, such as: Very bad belly pain that does not get better with treatment. A very bad headache that does not get better. Blurry vision. Double vision. Vomiting that does not get better. Sudden, fast weight gain. Sudden swelling in your hands, ankles, or face. Bleeding from your vagina. Blood in your pee. Shortness of breath. Chest pain. Weakness on one side of your body. Trouble talking. Your baby is not moving as much as usual. These symptoms may be an emergency. Get help right away. Call your local emergency services (911 in the [place] Do not wait to see if the symptoms will go away. Do not drive yourself to the hospital. Summary High blood pressure is also called hypertension. High blood pressure means that the force of the blood moving in your body is high enough to cause problems for you and your baby. Get help right away if you have symptoms of serious problems due to high blood pressure. Keep all prenatal and follow-up visits. This information is not intended to replace advice given to you by your health care provider. Make sure you discuss any questions you have with your health care provider. Document Revised: 08/27/2020 Document Reviewed: 08/27/2020 Elsevier Patient Education  2022 Else[employer] Inc.  

## 2022-03-22 ENCOUNTER — Encounter (HOSPITAL_COMMUNITY): Payer: Self-pay | Admitting: Family Medicine

## 2022-03-22 ENCOUNTER — Telehealth: Payer: Self-pay | Admitting: Certified Nurse Midwife

## 2022-03-22 ENCOUNTER — Other Ambulatory Visit: Payer: Self-pay

## 2022-03-22 ENCOUNTER — Inpatient Hospital Stay (HOSPITAL_BASED_OUTPATIENT_CLINIC_OR_DEPARTMENT_OTHER): Payer: Medicaid Other

## 2022-03-22 ENCOUNTER — Inpatient Hospital Stay (HOSPITAL_COMMUNITY)
Admission: AD | Admit: 2022-03-22 | Discharge: 2022-03-27 | DRG: 788 | Disposition: A | Discharge status: Home / Self Care | Payer: Medicaid Other | Attending: Obstetrics & Gynecology | Admitting: Obstetrics & Gynecology

## 2022-03-22 DIAGNOSIS — E669 Obesity, unspecified: Secondary | ICD-10-CM | POA: Diagnosis present

## 2022-03-22 DIAGNOSIS — O1423 HELLP syndrome (HELLP), third trimester: Secondary | ICD-10-CM

## 2022-03-22 DIAGNOSIS — Z880 Allergy status to penicillin: Secondary | ICD-10-CM | POA: Diagnosis not present

## 2022-03-22 DIAGNOSIS — O365923 Maternal care for other known or suspected poor fetal growth, second trimester, fetus 3: Secondary | ICD-10-CM

## 2022-03-22 DIAGNOSIS — J45909 Unspecified asthma, uncomplicated: Secondary | ICD-10-CM | POA: Diagnosis present

## 2022-03-22 DIAGNOSIS — O9952 Diseases of the respiratory system complicating childbirth: Secondary | ICD-10-CM | POA: Diagnosis present

## 2022-03-22 DIAGNOSIS — O99214 Obesity complicating childbirth: Secondary | ICD-10-CM | POA: Diagnosis present

## 2022-03-22 DIAGNOSIS — O365930 Maternal care for other known or suspected poor fetal growth, third trimester, not applicable or unspecified: Secondary | ICD-10-CM | POA: Diagnosis present

## 2022-03-22 DIAGNOSIS — O321XX0 Maternal care for breech presentation, not applicable or unspecified: Secondary | ICD-10-CM | POA: Diagnosis present

## 2022-03-22 DIAGNOSIS — Z3A28 28 weeks gestation of pregnancy: Secondary | ICD-10-CM

## 2022-03-22 DIAGNOSIS — O1420 HELLP syndrome (HELLP), unspecified trimester: Principal | ICD-10-CM | POA: Diagnosis present

## 2022-03-22 DIAGNOSIS — O1424 HELLP syndrome, complicating childbirth: Principal | ICD-10-CM | POA: Diagnosis present

## 2022-03-22 DIAGNOSIS — O365990 Maternal care for other known or suspected poor fetal growth, unspecified trimester, not applicable or unspecified: Secondary | ICD-10-CM | POA: Diagnosis present

## 2022-03-22 LAB — COMPREHENSIVE METABOLIC PANEL
ALT: 184 IU/L — ABNORMAL HIGH (ref 0–32)
ALT: 224 U/L — ABNORMAL HIGH (ref 0–44)
ALT: 242 U/L — ABNORMAL HIGH (ref 0–44)
ALT: 219 U/L — ABNORMAL HIGH (ref 0–44)
AST: 196 IU/L — ABNORMAL HIGH (ref 0–40)
AST: 247 U/L — ABNORMAL HIGH (ref 15–41)
AST: 265 U/L — ABNORMAL HIGH (ref 15–41)
AST: 217 U/L — ABNORMAL HIGH (ref 15–41)
Albumin/Globulin Ratio: 1.1 — ABNORMAL LOW (ref 1.2–2.2)
Albumin: 2.4 g/dL — ABNORMAL LOW (ref 3.9–5.0)
Albumin: 1.8 g/dL — ABNORMAL LOW (ref 3.5–5.0)
Albumin: 1.9 g/dL — ABNORMAL LOW (ref 3.5–5.0)
Albumin: 1.9 g/dL — ABNORMAL LOW (ref 3.5–5.0)
Alkaline Phosphatase: 124 IU/L — ABNORMAL HIGH (ref 44–121)
Alkaline Phosphatase: 116 U/L (ref 38–126)
Alkaline Phosphatase: 117 U/L (ref 38–126)
Alkaline Phosphatase: 119 U/L (ref 38–126)
Anion gap: 8 (ref 5–15)
Anion gap: 9 (ref 5–15)
Anion gap: 8 (ref 5–15)
BUN/Creatinine Ratio: 17 (ref 9–23)
BUN: 17 mg/dL (ref 6–20)
BUN: 14 mg/dL (ref 6–20)
BUN: 14 mg/dL (ref 6–20)
BUN: 15 mg/dL (ref 6–20)
Bilirubin Total: 0.4 mg/dL (ref 0.0–1.2)
CO2: 23 mmol/L (ref 20–29)
CO2: 24 mmol/L (ref 22–32)
CO2: 21 mmol/L — ABNORMAL LOW (ref 22–32)
CO2: 20 mmol/L — ABNORMAL LOW (ref 22–32)
Calcium: 8.3 mg/dL — ABNORMAL LOW (ref 8.7–10.2)
Calcium: 8.2 mg/dL — ABNORMAL LOW (ref 8.9–10.3)
Calcium: 8 mg/dL — ABNORMAL LOW (ref 8.9–10.3)
Calcium: 8 mg/dL — ABNORMAL LOW (ref 8.9–10.3)
Chloride: 103 mmol/L (ref 96–106)
Chloride: 105 mmol/L (ref 98–111)
Chloride: 105 mmol/L (ref 98–111)
Chloride: 105 mmol/L (ref 98–111)
Creatinine, Ser: 0.99 mg/dL (ref 0.57–1.00)
Creatinine, Ser: 1.22 mg/dL — ABNORMAL HIGH (ref 0.44–1.00)
Creatinine, Ser: 1.12 mg/dL — ABNORMAL HIGH (ref 0.44–1.00)
Creatinine, Ser: 1.18 mg/dL — ABNORMAL HIGH (ref 0.44–1.00)
GFR, Estimated: >60 mL/min (ref >60)
GFR, Estimated: >60 mL/min (ref >60)
GFR, Estimated: >60 mL/min (ref >60)
Globulin, Total: 2.2 g/dL (ref 1.5–4.5)
Glucose, Bld: 78 mg/dL (ref 70–99)
Glucose, Bld: 81 mg/dL (ref 70–99)
Glucose, Bld: 152 mg/dL — ABNORMAL HIGH (ref 70–99)
Glucose: 90 mg/dL (ref 70–99)
Potassium: 4.1 mmol/L (ref 3.5–5.2)
Potassium: 4 mmol/L (ref 3.5–5.1)
Potassium: 4.2 mmol/L (ref 3.5–5.1)
Potassium: 4.2 mmol/L (ref 3.5–5.1)
Sodium: 136 mmol/L (ref 134–144)
Sodium: 137 mmol/L (ref 135–145)
Sodium: 135 mmol/L (ref 135–145)
Sodium: 133 mmol/L — ABNORMAL LOW (ref 135–145)
Total Bilirubin: 0.9 mg/dL (ref 0.3–1.2)
Total Bilirubin: 0.9 mg/dL (ref 0.3–1.2)
Total Bilirubin: 0.6 mg/dL (ref 0.3–1.2)
Total Protein: 4.6 g/dL — ABNORMAL LOW (ref 6.0–8.5)
Total Protein: 5.1 g/dL — ABNORMAL LOW (ref 6.5–8.1)
Total Protein: 5.3 g/dL — ABNORMAL LOW (ref 6.5–8.1)
Total Protein: 5.1 g/dL — ABNORMAL LOW (ref 6.5–8.1)
eGFR: 80 mL/min/{1.73_m2} (ref >59)

## 2022-03-22 LAB — PROTEIN / CREATININE RATIO, URINE
Creatinine, Urine: 427.6 mg/dL
Creatinine, Urine: 352.99 mg/dL
Protein, Ur: 3355.4 mg/dL
Protein/Creat Ratio: 7847 mg/g creat — ABNORMAL HIGH (ref 0–200)
Total Protein, Urine: >3000 mg/dL

## 2022-03-22 LAB — CBC
HCT: 45.2 % (ref 36.0–46.0)
HCT: 44.3 % (ref 36.0–46.0)
HCT: 43.3 % (ref 36.0–46.0)
Hematocrit: 43.9 % (ref 34.0–46.6)
Hemoglobin: 14.7 g/dL (ref 11.1–15.9)
Hemoglobin: 15.2 g/dL — ABNORMAL HIGH (ref 12.0–15.0)
Hemoglobin: 15 g/dL (ref 12.0–15.0)
Hemoglobin: 14.5 g/dL (ref 12.0–15.0)
MCH: 28.6 pg (ref 26.6–33.0)
MCH: 28.8 pg (ref 26.0–34.0)
MCH: 29 pg (ref 26.0–34.0)
MCH: 28.7 pg (ref 26.0–34.0)
MCHC: 33.5 g/dL (ref 31.5–35.7)
MCHC: 33.6 g/dL (ref 30.0–36.0)
MCHC: 33.9 g/dL (ref 30.0–36.0)
MCHC: 33.5 g/dL (ref 30.0–36.0)
MCV: 85 fL (ref 79–97)
MCV: 85.6 fL (ref 80.0–100.0)
MCV: 85.5 fL (ref 80.0–100.0)
MCV: 85.7 fL (ref 80.0–100.0)
Platelets: 103 10*3/uL — ABNORMAL LOW (ref 150–450)
Platelets: 90 10*3/uL — ABNORMAL LOW (ref 150–400)
Platelets: 78 10*3/uL — ABNORMAL LOW (ref 150–400)
Platelets: 93 10*3/uL — ABNORMAL LOW (ref 150–400)
RBC: 5.14 x10E6/uL (ref 3.77–5.28)
RBC: 5.28 MIL/uL — ABNORMAL HIGH (ref 3.87–5.11)
RBC: 5.18 MIL/uL — ABNORMAL HIGH (ref 3.87–5.11)
RBC: 5.05 MIL/uL (ref 3.87–5.11)
RDW: 13.3 % (ref 11.7–15.4)
RDW: 13.9 % (ref 11.5–15.5)
RDW: 14 % (ref 11.5–15.5)
RDW: 14.1 % (ref 11.5–15.5)
WBC: 15.9 10*3/uL — ABNORMAL HIGH (ref 3.4–10.8)
WBC: 15.1 10*3/uL — ABNORMAL HIGH (ref 4.0–10.5)
WBC: 19.4 10*3/uL — ABNORMAL HIGH (ref 4.0–10.5)
WBC: 17.5 10*3/uL — ABNORMAL HIGH (ref 4.0–10.5)
nRBC: 0.1 % (ref 0.0–0.2)
nRBC: 0.1 % (ref 0.0–0.2)
nRBC: 0 % (ref 0.0–0.2)

## 2022-03-22 LAB — GLUCOSE, 1 HOUR GESTATIONAL: Gestational Diabetes Screen: 101 mg/dL (ref 70–139)

## 2022-03-22 LAB — RPR: RPR Ser Ql: NONREACTIVE

## 2022-03-22 LAB — FIBRINOGEN: Fibrinogen: >800 mg/dL — ABNORMAL HIGH (ref 210–475)

## 2022-03-22 LAB — PREPARE RBC (CROSSMATCH)

## 2022-03-22 MED ORDER — SODIUM CHLORIDE 0.9% FLUSH
3.0000 mL | Freq: Two times a day (BID) | INTRAVENOUS | Status: DC
  Administered 2022-03-25 – 2022-03-26 (×3): 3 mL via INTRAVENOUS

## 2022-03-22 MED ORDER — LABETALOL HCL 5 MG/ML IV SOLN: 80.0000 mg | INTRAVENOUS | Status: DC | PRN

## 2022-03-22 MED ORDER — PRENATAL MULTIVITAMIN CH
1.0000 | ORAL_TABLET | Freq: Every day | ORAL | Status: DC
  Administered 2022-03-23: 1 via ORAL
  Filled 2022-03-22: qty 1

## 2022-03-22 MED ORDER — LABETALOL HCL 5 MG/ML IV SOLN: 40.0000 mg | INTRAVENOUS | Status: DC | PRN

## 2022-03-22 MED ORDER — SODIUM CHLORIDE 0.9 % IV SOLN: 250.0000 mL | INTRAVENOUS | Status: DC | PRN

## 2022-03-22 MED ORDER — ZOLPIDEM TARTRATE 5 MG PO TABS: 5.0000 mg | ORAL_TABLET | Freq: Every evening | ORAL | Status: DC | PRN

## 2022-03-22 MED ORDER — MAGNESIUM SULFATE 40 GM/1000ML IV SOLN
2.0000 g/h | INTRAVENOUS | Status: AC
  Administered 2022-03-22 – 2022-03-25 (×5): 2 g/h via INTRAVENOUS
  Filled 2022-03-22 (×4): qty 1000

## 2022-03-22 MED ORDER — CALCIUM CARBONATE ANTACID 500 MG PO CHEW: 2.0000 | CHEWABLE_TABLET | ORAL | Status: DC | PRN

## 2022-03-22 MED ORDER — LACTATED RINGERS IV SOLN: INTRAVENOUS | Status: DC

## 2022-03-22 MED ORDER — NIFEDIPINE ER OSMOTIC RELEASE 30 MG PO TB24
30.0000 mg | ORAL_TABLET | Freq: Two times a day (BID) | ORAL | Status: DC
  Administered 2022-03-22 – 2022-03-26 (×8): 30 mg via ORAL
  Filled 2022-03-22 (×8): qty 1

## 2022-03-22 MED ORDER — SODIUM CHLORIDE 0.9% IV SOLUTION: Freq: Once | INTRAVENOUS | Status: AC

## 2022-03-22 MED ORDER — BETAMETHASONE SOD PHOS & ACET 6 (3-3) MG/ML IJ SUSP
12.0000 mg | INTRAMUSCULAR | Status: AC
  Administered 2022-03-22 – 2022-03-23 (×2): 12 mg via INTRAMUSCULAR
  Filled 2022-03-22: qty 5

## 2022-03-22 MED ORDER — DOCUSATE SODIUM 100 MG PO CAPS
100.0000 mg | ORAL_CAPSULE | Freq: Every day | ORAL | Status: DC
  Administered 2022-03-23 – 2022-03-26 (×3): 100 mg via ORAL
  Filled 2022-03-22 (×3): qty 1

## 2022-03-22 MED ORDER — ACETAMINOPHEN 325 MG PO TABS: 650.0000 mg | ORAL_TABLET | ORAL | Status: DC | PRN

## 2022-03-22 MED ORDER — LABETALOL HCL 5 MG/ML IV SOLN: 20.0000 mg | INTRAVENOUS | Status: DC | PRN

## 2022-03-22 MED ORDER — MAGNESIUM SULFATE BOLUS VIA INFUSION
4.0000 g | Freq: Once | INTRAVENOUS | Status: AC
  Administered 2022-03-22: 4 g via INTRAVENOUS
  Filled 2022-03-22: qty 1000

## 2022-03-22 MED ORDER — HYDRALAZINE HCL 20 MG/ML IJ SOLN: 10.0000 mg | INTRAMUSCULAR | Status: DC | PRN

## 2022-03-22 MED ORDER — SODIUM CHLORIDE 0.9% FLUSH: 3.0000 mL | INTRAVENOUS | Status: DC | PRN

## 2022-03-22 NOTE — Consult Note (Addendum)
Brief MFM note ? ?Brianna Gilmore is a G2P0 who is seen in the MAU for further evaluation of hypertension and new elevated LFT's and decreased platelets. ? ?I was called by Dr. Kenney Houseman regarding a plan for delivery for Brianna Gilmore. ? ?A detailed exam was performed revealing severe FGR with reversed UA Dopplers and subjectively low amniotic fluid. ? ?Her labs have slightly worsened with plt going from 100 to 90 and LFT from 190's to 240's. I addition serum creatnine is 1.22. UPC > 7000 ? ? ?  03/22/2022  ?  5:00 PM 03/22/2022  ?  4:45 PM 03/22/2022  ?  4:30 PM  ?Vitals with BMI  ?Systolic 152 144 539  ?Diastolic 96 101 92  ?Pulse 81 82 78  ?  ? ?  Latest Ref Rng & Units 03/22/2022  ? 11:49 AM 03/21/2022  ?  4:31 PM 02/25/2022  ? 10:45 AM  ?CMP  ?Glucose 70 - 99 mg/dL 78   90   73    ?BUN 6 - 20 mg/dL 14   17   11     ?Creatinine 0.44 - 1.00 mg/dL   7.67   3.41    ?Sodium 135 - 145 mmol/L 137   136   139    ?Potassium 3.5 - 5.1 mmol/L 4.0   4.1   4.3    ?Chloride 98 - 111 mmol/L 105   103   102    ?CO2 22 - 32 mmol/L 24   23   20     ?Calcium 8.9 - 10.3 mg/dL 8.2   8.3   9.3    ?Total Protein 6.5 - 8.1 g/dL 5.1   4.6   5.7    ?Total Bilirubin 0.3 - 1.2 mg/dL 0.9   0.4   9.37    ?Alkaline Phos 38 - 126 U/L 116   124   82    ?AST 15 - 41 U/L 247   196   25    ?ALT 0 - 44 U/L 224   184   25    ? ? ?  Latest Ref Rng & Units 03/22/2022  ?  4:09 PM 03/22/2022  ? 11:49 AM 03/21/2022  ?  4:31 PM  ?CBC  ?WBC 4.0 - 10.5 K/uL 19.4   15.1   15.9    ?Hemoglobin 12.0 - 15.0 g/dL 05/22/2022   05/21/2022   24.0    ?Hematocrit 36.0 - 46.0 % 44.3   45.2   43.9    ?Platelets 150 - 400 K/uL 78   90   103    ? ? ?The fetal tracing initially demonstrated accelerations with some mild variable decelerations. Later in the tracing contact was intermittent and not well interpretable however, There were one late deceleration noted still with variability consistent with gestational age. ? ?I discussed with Dr. 97.3 to continuous monitor administer betamethasone with q 4 hour  labs and initiation of magnesium for seizure prophylaxis. I recommend delivery if fetal tracing is non-reassuring, after betamethasone administration, uncontrolled blood pressure or significantly worsening labs. ? ?Dr. 53.2 conveyed that there were a few acute labor issues and will proceed when safe to do so in the context of the above recommendations. ? ?I spent 30 minutes in discussion and in reviewed the chart. ? ?All questions answered. ? ?Shawnie Pons, MD ?

## 2022-03-22 NOTE — Telephone Encounter (Signed)
Pt called stating that she was prescribed meds in her apt yesterday - the pharmacy will not have them ready until today after 2pm, she is wanting to make sure it was still okay to have bp follow up on Thursday. She also wanted to make provider aware of pain she has been having in upper right side of stomach state sit has been present since last week- but she was throwing up last week worse on Sunday. Please advise.  ?

## 2022-03-22 NOTE — MAU Note (Signed)
.  Brianna Gilmore is a 26 y.o. at [redacted]w[redacted]d here in MAU reporting that provider sent her based on lab results from 03/21/22 indicating possible HELLP syndrome.  RUQ pain started 03/19/22 that has progressed.  Patient states she has occasional spots in eyes, occasional HA, swelling in lower legs. Patient was provided BP medication but has not started due to pharmacy not having it in stock.  ? ? ?Pain score: 5 ?Vitals:  ? 03/22/22 1221  ?BP: (!) 147/106  ?Pulse: 82  ?Resp: 18  ?Temp: 98.3 ?F (36.8 ?C)  ?SpO2: 99%  ?   ?FHT:145 ? ?POC for repeat lab work, IV Fluids, Magnesium Sulfate, BMZ and HTN protocol PRN.  ?

## 2022-03-22 NOTE — Consult Note (Signed)
Neonatology Consult Note: ? ?At the request of the patients obstetrician Dr. Kennon Rounds I met with Brianna Gilmore (and FOB) who is a 26 y.o. female G2P0010 with IUP at 69w1dby LMP.  She presented for assessment for HELLP syndrome after having elevated BP at office visit yesterday and last week, and then elevated AST and ALT yesterday. ? ?She is being managed with Magnesium sulfate, BMZ with plan for delivery after her BMZ course is complete.   ? ?We discussed morbidity/mortality at this gestional age, delivery room resuscitation, including intubation and surfactant in DR.  Discussed mechanical ventilation and risk for chronic lung disease, risk for IVH with potential for motor / cognitive deficits, ROP, NEC, sepsis, as well as temperature instability and feeding immaturity.  Discussed NG / OG feeds, benefits of MBM in reducing incidence of NEC.   ?Discussed likely length of stay. ? ?Thank you for allowing [facility] participate in her care.  Please call with questions. ? ?BHiginio Roger DO  ?Neonatologist ? ?The total length of face-to-face or floor / unit time for this encounter was 35 minutes.  Counseling and / or coordination of care was greater than fifty percent of the time.     ? ? ? ? ?

## 2022-03-22 NOTE — H&P (Addendum)
?Brianna Gilmore is a 26 y.o. female G2P0010 with IUP at 1w1dby LMP  presenting for assessment for HELLP syndrome after having elevated BP at office visit yesterday and last week, and then elevated AST ALT yesterday in clinic. She was started on labetelol 100 BID on 4/3; she has not started taking her medication yet because pharmacy was out of stock.  ? ?She reports positive fetal movement. She denies leakage of fluid or vaginal bleeding. She reports on/off again HA since Thursday, and reports RUQ pain that started on Saturday. The RUQ is constant and aggravated by coughing, sneezing or laughing. She has occasional swelling and occasional blurry vision. She denies ctx, LOF, VB, decreased fetal movements.  ? ?Prenatal History/Complications: ?PNC at [facility] ?Pregnancy complications:  ?- Past Medical History: ?Past Medical History:  ?Diagnosis Date  ? Asthma   ? Childhood  ? Headache   ? ~26years old when pt stopped having regular migraines  ? ? ?Past Surgical History: ?Past Surgical History:  ?Procedure Laterality Date  ? WISDOM TOOTH EXTRACTION    ? ? ?Obstetrical History: ?OB History   ? ? Gravida  ?2  ? Para  ?   ? Term  ?   ? Preterm  ?   ? AB  ?1  ? Living  ?0  ?  ? ? SAB  ?1  ? IAB  ?   ? Ectopic  ?   ? Multiple  ?   ? Live Births  ?0  ?   ?  ?  ? ? ? ?Social History: ?Social History  ? ?Socioeconomic History  ? Marital status: Single  ?  Spouse name: Not on file  ? Number of children: Not on file  ? Years of education: Not on file  ? Highest education level: Not on file  ?Occupational History  ? Occupation: IArmed forces logistics/support/administrative officer ?Tobacco Use  ? Smoking status: Never  ? Smokeless tobacco: Never  ?Substance and Sexual Activity  ? Alcohol use: No  ? Drug use: Not Currently  ? Sexual activity: Yes  ?  Birth control/protection: None  ?Other Topics Concern  ? Not on file  ?Social History Narrative  ? Not on file  ? ?Social Determinants of Health  ? ?Financial Resource Strain: Not on file  ?Food Insecurity: Not on file   ?Transportation Needs: Not on file  ?Physical Activity: Not on file  ?Stress: Not on file  ?Social Connections: Not on file  ? ? ?Family History: ?Family History  ?Problem Relation Age of Onset  ? Diabetes Maternal Grandmother   ? Hypertension Maternal Grandmother   ? ? ?Allergies: ?Allergies  ?Allergen Reactions  ? Doxycycline Shortness Of Breath  ?  Unable to breathe, SOB  ? Zithromax [Azithromycin] Hives  ? Penicillins Rash  ? ? ?Medications Prior to Admission  ?Medication Sig Dispense Refill Last Dose  ? aspirin EC 81 MG tablet Take 1 tablet (81 mg total) by mouth daily. Swallow whole. Start at 14 weeks. (Patient not taking: Reported on 02/22/2022) 30 tablet 11   ? Doxylamine-Pyridoxine 10-10 MG TBEC Take 1 tablet by mouth 4 (four) times daily. Day 1 &2: 2 tablet at bedtimeDay 3 : if symptoms persists 1 tablet am; 2 tablet at bedtimeDay 4: 1 tablet am, 1 tab afternoon, 2 tab at bedtime (Patient not taking: Reported on 11/26/2021) 120 tablet 5   ? labetalol (NORMODYNE) 100 MG tablet Take 1 tablet (100 mg total) by mouth 2 (two) times daily. 60 tablet 0   ?  Prenatal Vit-Fe Fumarate-FA (PRENATAL PO) Take by mouth.     ? ? ?Review of Systems  ? ?Constitutional: Negative for fever and chills ?Eyes: Negative for visual disturbances ?Respiratory: Negative for shortness of breath, dyspnea ?Cardiovascular: Negative for chest pain or palpitations  ?Gastrointestinal: Negative for vomiting, diarrhea and constipation.  POSITIVE for abdominal pain (contractions) ?Genitourinary: Negative for dysuria and urgency ?Musculoskeletal: Negative for back pain, joint pain, myalgias  ?Neurological: Negative for dizziness and headaches ? ?Blood pressure (!) 141/96, pulse 72, temperature 98.5 ?F (36.9 ?C), temperature source Oral, resp. rate 18, height '5\' 3"'  (1.6 m), weight 132.6 kg, last menstrual period 09/06/2021, SpO2 100 %. ?General appearance: alert, cooperative, and no distress ?Lungs: normal respiratory effort ?Heart: regular rate  and rhythm ?Abdomen: soft, non-tender; bowel sounds normal ?Extremities: Homans sign is negative, no sign of DVT ?DTR's 2+ ?Presentation: cephalic ?Fetal monitoring  Baseline: 150 bpm, min variability, no acels, no decels ?Uterine activity  None ?  ? ? ?Prenatal labs: ?ABO, Rh: --/--/B POS (04/04 1149) ?Antibody: NEG (04/04 1149) ?Rubella: 12.40 (12/01 1016) ?RPR: Non Reactive (04/03 1631)  ?HBsAg: Negative (12/01 1016)  ?HIV: Non Reactive (12/01 1016)  ?GBS:   Unknown ?1 hr Glucola Passed ?Genetic screening   ?Anatomy US normal ? ?Prenatal Transfer Tool  ?Maternal Diabetes: No passed her 1 hour on 4/3 ?Genetic Screening: Normal per patient  ?Maternal Ultrasounds/Referrals: Normal ?Fetal Ultrasounds or other Referrals:  Other:  ?Maternal Substance Abuse:  No ?Significant Maternal Medications:  Meds include: Other: started on labetalol 100 mg on 4/3 but has not started taking it yet (pharmacy shortage) ?Significant Maternal Lab Results: Other:  ? ?Results for orders placed or performed during the hospital encounter of 03/22/22 (from the past 24 hour(s))  ?CBC  ? Collection Time: 03/22/22 11:49 AM  ?Result Value Ref Range  ? WBC 15.1 (H) 4.0 - 10.5 K/uL  ? RBC 5.28 (H) 3.87 - 5.11 MIL/uL  ? Hemoglobin 15.2 (H) 12.0 - 15.0 g/dL  ? HCT 45.2 36.0 - 46.0 %  ? MCV 85.6 80.0 - 100.0 fL  ? MCH 28.8 26.0 - 34.0 pg  ? MCHC 33.6 30.0 - 36.0 g/dL  ? RDW 13.9 11.5 - 15.5 %  ? Platelets 90 (L) 150 - 400 K/uL  ? nRBC 0.1 0.0 - 0.2 %  ?Comprehensive metabolic panel  ? Collection Time: 03/22/22 11:49 AM  ?Result Value Ref Range  ? Sodium 137 135 - 145 mmol/L  ? Potassium 4.0 3.5 - 5.1 mmol/L  ? Chloride 105 98 - 111 mmol/L  ? CO2 24 22 - 32 mmol/L  ? Glucose, Bld 78 70 - 99 mg/dL  ? BUN 14 6 - 20 mg/dL  ? Creatinine, Ser 1.22 (H) 0.44 - 1.00 mg/dL  ? Calcium 8.2 (L) 8.9 - 10.3 mg/dL  ? Total Protein 5.1 (L) 6.5 - 8.1 g/dL  ? Albumin 1.8 (L) 3.5 - 5.0 g/dL  ? AST 247 (H) 15 - 41 U/L  ? ALT 224 (H) 0 - 44 U/L  ? Alkaline Phosphatase  116 38 - 126 U/L  ? Total Bilirubin 0.9 0.3 - 1.2 mg/dL  ? GFR, Estimated >60 >60 mL/min  ? Anion gap 8 5 - 15  ?Type and screen [place]  ? Collection Time: 03/22/22 11:49 AM  ?Result Value Ref Range  ? ABO/RH(D) B POS   ? Antibody Screen NEG   ? Sample Expiration    ?  03/25/2022,2359 ?Performed at [facility],  [address] [place], [place] [address] ?  ?Results for orders placed or performed in visit on 03/21/22 (from the past 24 hour(s))  ?POC Urinalysis Dipstick OB  ? Collection Time: 03/21/22  4:27 PM  ?Result Value Ref Range  ? Color, UA    ? Clarity, UA    ? Glucose, UA Negative Negative  ? Bilirubin, UA small   ? Ketones, UA neg   ? Spec Grav, UA 1.020 1.010 - 1.025  ? Blood, UA large   ? pH, UA 6.5 5.0 - 8.0  ? POC,PROTEIN,UA 4+ Negative, Trace, Small (1+), Moderate (2+), Large (3+), 4+  ? Urobilinogen, UA 0.2 0.2 or 1.0 E.U./dL  ? Nitrite, UA neg   ? Leukocytes, UA Negative Negative  ? Appearance    ? Odor    ?CBC  ? Collection Time: 03/21/22  4:31 PM  ?Result Value Ref Range  ? WBC 15.9 (H) 3.4 - 10.8 x10E3/uL  ? RBC 5.14 3.77 - 5.28 x10E6/uL  ? Hemoglobin 14.7 11.1 - 15.9 g/dL  ? Hematocrit 43.9 34.0 - 46.6 %  ? MCV 85 79 - 97 fL  ? MCH 28.6 26.6 - 33.0 pg  ? MCHC 33.5 31.5 - 35.7 g/dL  ? RDW 13.3 11.7 - 15.4 %  ? Platelets 103 (L) 150 - 450 x10E3/uL  ?Glucose, 1 hour  ? Collection Time: 03/21/22  4:31 PM  ?Result Value Ref Range  ? Gestational Diabetes Screen 101 70 - 139 mg/dL  ?RPR  ? Collection Time: 03/21/22  4:31 PM  ?Result Value Ref Range  ? RPR Ser Ql Non Reactive Non Reactive  ?Comprehensive metabolic panel  ? Collection Time: 03/21/22  4:31 PM  ?Result Value Ref Range  ? Glucose 90 70 - 99 mg/dL  ? BUN 17 6 - 20 mg/dL  ? Creatinine, Ser 0.99 0.57 - 1.00 mg/dL  ? eGFR 80 >59 mL/min/1.73  ? BUN/Creatinine Ratio 17 9 - 23  ? Sodium 136 134 - 144 mmol/L  ? Potassium 4.1 3.5 - 5.2 mmol/L  ? Chloride 103 96 - 106 mmol/L  ? CO2 23 20 - 29 mmol/L  ? Calcium 8.3 (L) 8.7 -  10.2 mg/dL  ? Total Protein 4.6 (L) 6.0 - 8.5 g/dL  ? Albumin 2.4 (L) 3.9 - 5.0 g/dL  ? Globulin, Total 2.2 1.5 - 4.5 g/dL  ? Albumin/Globulin Ratio 1.1 (L) 1.2 - 2.2  ? Bilirubin Total 0.4 0.0 - 1.2 mg/

## 2022-03-23 DIAGNOSIS — O1420 HELLP syndrome (HELLP), unspecified trimester: Secondary | ICD-10-CM | POA: Diagnosis not present

## 2022-03-23 DIAGNOSIS — O365990 Maternal care for other known or suspected poor fetal growth, unspecified trimester, not applicable or unspecified: Secondary | ICD-10-CM | POA: Diagnosis present

## 2022-03-23 LAB — COMPREHENSIVE METABOLIC PANEL
ALT: 176 U/L — ABNORMAL HIGH (ref 0–44)
ALT: 172 U/L — ABNORMAL HIGH (ref 0–44)
ALT: 168 U/L — ABNORMAL HIGH (ref 0–44)
ALT: 153 U/L — ABNORMAL HIGH (ref 0–44)
AST: 147 U/L — ABNORMAL HIGH (ref 15–41)
AST: 130 U/L — ABNORMAL HIGH (ref 15–41)
AST: 116 U/L — ABNORMAL HIGH (ref 15–41)
AST: 99 U/L — ABNORMAL HIGH (ref 15–41)
Albumin: 1.6 g/dL — ABNORMAL LOW (ref 3.5–5.0)
Albumin: 1.7 g/dL — ABNORMAL LOW (ref 3.5–5.0)
Albumin: 1.8 g/dL — ABNORMAL LOW (ref 3.5–5.0)
Albumin: 1.8 g/dL — ABNORMAL LOW (ref 3.5–5.0)
Alkaline Phosphatase: 106 U/L (ref 38–126)
Alkaline Phosphatase: 123 U/L (ref 38–126)
Alkaline Phosphatase: 121 U/L (ref 38–126)
Alkaline Phosphatase: 126 U/L (ref 38–126)
Anion gap: 8 (ref 5–15)
Anion gap: 9 (ref 5–15)
Anion gap: 8 (ref 5–15)
Anion gap: 8 (ref 5–15)
BUN: 16 mg/dL (ref 6–20)
BUN: 17 mg/dL (ref 6–20)
BUN: 17 mg/dL (ref 6–20)
BUN: 14 mg/dL (ref 6–20)
CO2: 20 mmol/L — ABNORMAL LOW (ref 22–32)
CO2: 21 mmol/L — ABNORMAL LOW (ref 22–32)
CO2: 22 mmol/L (ref 22–32)
CO2: 21 mmol/L — ABNORMAL LOW (ref 22–32)
Calcium: 7.5 mg/dL — ABNORMAL LOW (ref 8.9–10.3)
Calcium: 7.5 mg/dL — ABNORMAL LOW (ref 8.9–10.3)
Calcium: 7.6 mg/dL — ABNORMAL LOW (ref 8.9–10.3)
Calcium: 7.3 mg/dL — ABNORMAL LOW (ref 8.9–10.3)
Chloride: 105 mmol/L (ref 98–111)
Chloride: 101 mmol/L (ref 98–111)
Chloride: 103 mmol/L (ref 98–111)
Chloride: 104 mmol/L (ref 98–111)
Creatinine, Ser: 1.07 mg/dL — ABNORMAL HIGH (ref 0.44–1.00)
Creatinine, Ser: 1.07 mg/dL — ABNORMAL HIGH (ref 0.44–1.00)
Creatinine, Ser: 1.02 mg/dL — ABNORMAL HIGH (ref 0.44–1.00)
Creatinine, Ser: 0.97 mg/dL (ref 0.44–1.00)
GFR, Estimated: >60 mL/min (ref >60)
GFR, Estimated: >60 mL/min (ref >60)
GFR, Estimated: >60 mL/min (ref >60)
GFR, Estimated: >60 mL/min (ref >60)
Glucose, Bld: 127 mg/dL — ABNORMAL HIGH (ref 70–99)
Glucose, Bld: 111 mg/dL — ABNORMAL HIGH (ref 70–99)
Glucose, Bld: 115 mg/dL — ABNORMAL HIGH (ref 70–99)
Glucose, Bld: 123 mg/dL — ABNORMAL HIGH (ref 70–99)
Potassium: 4.5 mmol/L (ref 3.5–5.1)
Potassium: 4.7 mmol/L (ref 3.5–5.1)
Potassium: 4.5 mmol/L (ref 3.5–5.1)
Potassium: 4.2 mmol/L (ref 3.5–5.1)
Sodium: 133 mmol/L — ABNORMAL LOW (ref 135–145)
Sodium: 131 mmol/L — ABNORMAL LOW (ref 135–145)
Sodium: 133 mmol/L — ABNORMAL LOW (ref 135–145)
Sodium: 133 mmol/L — ABNORMAL LOW (ref 135–145)
Total Bilirubin: 0.4 mg/dL (ref 0.3–1.2)
Total Bilirubin: 0.5 mg/dL (ref 0.3–1.2)
Total Bilirubin: 0.6 mg/dL (ref 0.3–1.2)
Total Bilirubin: 0.4 mg/dL (ref 0.3–1.2)
Total Protein: 4.6 g/dL — ABNORMAL LOW (ref 6.5–8.1)
Total Protein: 5.2 g/dL — ABNORMAL LOW (ref 6.5–8.1)
Total Protein: 5.3 g/dL — ABNORMAL LOW (ref 6.5–8.1)
Total Protein: 5.5 g/dL — ABNORMAL LOW (ref 6.5–8.1)

## 2022-03-23 LAB — RPR: RPR Ser Ql: NONREACTIVE

## 2022-03-23 LAB — CBC
HCT: 37.7 % (ref 36.0–46.0)
HCT: 42.8 % (ref 36.0–46.0)
HCT: 43.2 % (ref 36.0–46.0)
HCT: 43.9 % (ref 36.0–46.0)
Hemoglobin: 13.3 g/dL (ref 12.0–15.0)
Hemoglobin: 14.5 g/dL (ref 12.0–15.0)
Hemoglobin: 15 g/dL (ref 12.0–15.0)
Hemoglobin: 15.1 g/dL — ABNORMAL HIGH (ref 12.0–15.0)
MCH: 28.7 pg (ref 26.0–34.0)
MCH: 29.1 pg (ref 26.0–34.0)
MCH: 29.6 pg (ref 26.0–34.0)
MCH: 29.8 pg (ref 26.0–34.0)
MCHC: 33.6 g/dL (ref 30.0–36.0)
MCHC: 34.4 g/dL (ref 30.0–36.0)
MCHC: 35 g/dL (ref 30.0–36.0)
MCHC: 35.3 g/dL (ref 30.0–36.0)
MCV: 84.4 fL (ref 80.0–100.0)
MCV: 84.5 fL (ref 80.0–100.0)
MCV: 84.6 fL (ref 80.0–100.0)
MCV: 85.4 fL (ref 80.0–100.0)
Platelets: 106 10*3/uL — ABNORMAL LOW (ref 150–400)
Platelets: 114 10*3/uL — ABNORMAL LOW (ref 150–400)
Platelets: 147 10*3/uL — ABNORMAL LOW (ref 150–400)
Platelets: 92 10*3/uL — ABNORMAL LOW (ref 150–400)
RBC: 4.46 MIL/uL (ref 3.87–5.11)
RBC: 5.06 MIL/uL (ref 3.87–5.11)
RBC: 5.07 MIL/uL (ref 3.87–5.11)
RBC: 5.19 MIL/uL — ABNORMAL HIGH (ref 3.87–5.11)
RDW: 13.9 % (ref 11.5–15.5)
RDW: 14 % (ref 11.5–15.5)
RDW: 14 % (ref 11.5–15.5)
RDW: 14.1 % (ref 11.5–15.5)
WBC: 14.6 10*3/uL — ABNORMAL HIGH (ref 4.0–10.5)
WBC: 17.5 10*3/uL — ABNORMAL HIGH (ref 4.0–10.5)
WBC: 20.6 10*3/uL — ABNORMAL HIGH (ref 4.0–10.5)
WBC: 24.1 10*3/uL — ABNORMAL HIGH (ref 4.0–10.5)
nRBC: 0 % (ref 0.0–0.2)
nRBC: 0.1 % (ref 0.0–0.2)
nRBC: 0.1 % (ref 0.0–0.2)
nRBC: 0.1 % (ref 0.0–0.2)

## 2022-03-23 NOTE — Progress Notes (Signed)
Patient ID: Brianna Gilmore, female   DOB: [birth year], 25 y.o.   MRN: [id number] ?FACULTY PRACTICE ANTEPARTUM(COMPREHENSIVE) NOTE ? ?Brianna Gilmore is a 26 y.o. G2P0010 at [redacted]w[redacted]d by LMP, early ultrasound who is admitted for help syndrome.   ?Fetal presentation is breech. ?Length of Stay:  1  Days ? ?ASSESSMENT: ?Principal Problem: ?  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  Obesity (BMI 30-39.9) ?  Fetal growth restriction antepartum ? ? ?PLAN: ?HELLP syndrome ?Given extreme prematurity and trying to by 48 hours prior to proceeding with delivery. ?Labs have improved with course of betamethasone, second dose due today ?Plan for delivery on 03/24/2022 ? ?Fetal growth restriction with reversed end-diastolic flow ?Baby with spontaneous decels without contractions ?Also with help and fetal growth restriction seems unlikely that they would tolerate attempts at vaginal birth.   ?We will plan for scheduled C-section. ?Risks include but are not limited to bleeding, infection, injury to surrounding structures, including bowel, bladder and ureters, blood clots, and death.  Likelihood of success is high. ? ?Pregnancy at 28 weeks ?Status post NICU consult ? ?Obesity ? ?Subjective: ?Patient denies headache.  Her right upper quadrant pain has improved since arrival ?Patient reports the fetal movement as active. ?Patient reports uterine contraction  activity as none. ?Patient reports  vaginal bleeding as none. ?Patient describes fluid per vagina as None. ? ?Vitals:  Blood pressure 138/84, pulse 88, temperature 98.4 ?F (36.9 ?C), temperature source Oral, resp. rate 17, height 5\' 3"  (1.6 m), weight 132.6 kg, last menstrual period 09/06/2021, SpO2 99 %. ?Physical Examination: ? General appearance - alert, well appearing, and in no distress ?Chest - normal effort ?Abdomen - gravid, non-tender ?Fundal Height:  size less than dates ?Extremities: Homans sign is negative, no sign of  DVT  ?Membranes:intact ? ?Fetal Monitoring:  Baseline: 110 bpm, Variability: Fair (1-6 bpm), Accelerations: Non-reactive but appropriate for gestational age, and Decelerations: Variable: Moderate to severe at times ?Suspect poor variability related to magnesium sulfate.  Again our desires to get 48 hours status post first dose of betamethasone prior to delivery if possible ? ?Labs:  ?Results for orders placed or performed during the hospital encounter of 03/22/22 (from the past 24 hour(s))  ?Protein / creatinine ratio, urine  ? Collection Time: 03/22/22 12:43 PM  ?Result Value Ref Range  ? Creatinine, Urine 352.99 mg/dL  ? Total Protein, Urine >3,000 mg/dL  ? Protein Creatinine Ratio        0.00 - 0.15 mg/mg[Cre]  ?CBC  ? Collection Time: 03/22/22  4:09 PM  ?Result Value Ref Range  ? WBC 19.4 (H) 4.0 - 10.5 K/uL  ? RBC 5.18 (H) 3.87 - 5.11 MIL/uL  ? Hemoglobin 15.0 12.0 - 15.0 g/dL  ? HCT 44.3 36.0 - 46.0 %  ? MCV 85.5 80.0 - 100.0 fL  ? MCH 29.0 26.0 - 34.0 pg  ? MCHC 33.9 30.0 - 36.0 g/dL  ? RDW 14.0 11.5 - 15.5 %  ? Platelets 78 (L) 150 - 400 K/uL  ? nRBC 0.1 0.0 - 0.2 %  ?Comprehensive metabolic panel  ? Collection Time: 03/22/22  4:09 PM  ?Result Value Ref Range  ? Sodium 135 135 - 145 mmol/L  ? Potassium 4.2 3.5 - 5.1 mmol/L  ? Chloride 105 98 - 111 mmol/L  ? CO2 21 (L) 22 - 32 mmol/L  ? Glucose, Bld 81 70 - 99 mg/dL  ? BUN 14 6 - 20 mg/dL  ? Creatinine, Ser 1.12 (  H) 0.44 - 1.00 mg/dL  ? Calcium 8.0 (L) 8.9 - 10.3 mg/dL  ? Total Protein 5.3 (L) 6.5 - 8.1 g/dL  ? Albumin 1.9 (L) 3.5 - 5.0 g/dL  ? AST 265 (H) 15 - 41 U/L  ? ALT 242 (H) 0 - 44 U/L  ? Alkaline Phosphatase 117 38 - 126 U/L  ? Total Bilirubin 0.9 0.3 - 1.2 mg/dL  ? GFR, Estimated >60 >60 mL/min  ? Anion gap 9 5 - 15  ?Prepare RBC (crossmatch)  ? Collection Time: 03/22/22  4:40 PM  ?Result Value Ref Range  ? Order Confirmation    ?  ORDER PROCESSED BY BLOOD BANK ?Performed at [facility], [place] [address] [place], [place] [address] ?  ?CBC  ?  Collection Time: 03/22/22  7:38 PM  ?Result Value Ref Range  ? WBC 17.5 (H) 4.0 - 10.5 K/uL  ? RBC 5.05 3.87 - 5.11 MIL/uL  ? Hemoglobin 14.5 12.0 - 15.0 g/dL  ? HCT 43.3 36.0 - 46.0 %  ? MCV 85.7 80.0 - 100.0 fL  ? MCH 28.7 26.0 - 34.0 pg  ? MCHC 33.5 30.0 - 36.0 g/dL  ? RDW 14.1 11.5 - 15.5 %  ? Platelets 93 (L) 150 - 400 K/uL  ? nRBC 0.0 0.0 - 0.2 %  ?Comprehensive metabolic panel  ? Collection Time: 03/22/22  7:38 PM  ?Result Value Ref Range  ? Sodium 133 (L) 135 - 145 mmol/L  ? Potassium 4.2 3.5 - 5.1 mmol/L  ? Chloride 105 98 - 111 mmol/L  ? CO2 20 (L) 22 - 32 mmol/L  ? Glucose, Bld 152 (H) 70 - 99 mg/dL  ? BUN 15 6 - 20 mg/dL  ? Creatinine, Ser 1.18 (H) 0.44 - 1.00 mg/dL  ? Calcium 8.0 (L) 8.9 - 10.3 mg/dL  ? Total Protein 5.1 (L) 6.5 - 8.1 g/dL  ? Albumin 1.9 (L) 3.5 - 5.0 g/dL  ? AST 217 (H) 15 - 41 U/L  ? ALT 219 (H) 0 - 44 U/L  ? Alkaline Phosphatase 119 38 - 126 U/L  ? Total Bilirubin 0.6 0.3 - 1.2 mg/dL  ? GFR, Estimated >60 >60 mL/min  ? Anion gap 8 5 - 15  ?Fibrinogen  ? Collection Time: 03/22/22  7:38 PM  ?Result Value Ref Range  ? Fibrinogen >800 (H) 210 - 475 mg/dL  ?CBC  ? Collection Time: 03/23/22 12:19 AM  ?Result Value Ref Range  ? WBC 14.6 (H) 4.0 - 10.5 K/uL  ? RBC 4.46 3.87 - 5.11 MIL/uL  ? Hemoglobin 13.3 12.0 - 15.0 g/dL  ? HCT 37.7 36.0 - 46.0 %  ? MCV 84.5 80.0 - 100.0 fL  ? MCH 29.8 26.0 - 34.0 pg  ? MCHC 35.3 30.0 - 36.0 g/dL  ? RDW 14.0 11.5 - 15.5 %  ? Platelets 92 (L) 150 - 400 K/uL  ? nRBC 0.0 0.0 - 0.2 %  ?Comprehensive metabolic panel  ? Collection Time: 03/23/22 12:19 AM  ?Result Value Ref Range  ? Sodium 133 (L) 135 - 145 mmol/L  ? Potassium 4.5 3.5 - 5.1 mmol/L  ? Chloride 105 98 - 111 mmol/L  ? CO2 20 (L) 22 - 32 mmol/L  ? Glucose, Bld 127 (H) 70 - 99 mg/dL  ? BUN 16 6 - 20 mg/dL  ? Creatinine, Ser 1.07 (H) 0.44 - 1.00 mg/dL  ? Calcium 7.5 (L) 8.9 - 10.3 mg/dL  ? Total Protein 4.6 (L) 6.5 -  8.1 g/dL  ? Albumin 1.6 (L) 3.5 - 5.0 g/dL  ? AST 147 (H) 15 - 41 U/L  ? ALT 176 (H) 0  - 44 U/L  ? Alkaline Phosphatase 106 38 - 126 U/L  ? Total Bilirubin 0.4 0.3 - 1.2 mg/dL  ? GFR, Estimated >60 >60 mL/min  ? Anion gap 8 5 - 15  ?CBC  ? Collection Time: 03/23/22  5:34 AM  ?Result Value Ref Range  ? WBC 17.5 (H) 4.0 - 10.5 K/uL  ? RBC 5.06 3.87 - 5.11 MIL/uL  ? Hemoglobin 14.5 12.0 - 15.0 g/dL  ? HCT 43.2 36.0 - 46.0 %  ? MCV 85.4 80.0 - 100.0 fL  ? MCH 28.7 26.0 - 34.0 pg  ? MCHC 33.6 30.0 - 36.0 g/dL  ? RDW 14.0 11.5 - 15.5 %  ? Platelets 106 (L) 150 - 400 K/uL  ? nRBC 0.1 0.0 - 0.2 %  ?Comprehensive metabolic panel  ? Collection Time: 03/23/22  5:34 AM  ?Result Value Ref Range  ? Sodium 131 (L) 135 - 145 mmol/L  ? Potassium 4.7 3.5 - 5.1 mmol/L  ? Chloride 101 98 - 111 mmol/L  ? CO2 21 (L) 22 - 32 mmol/L  ? Glucose, Bld 111 (H) 70 - 99 mg/dL  ? BUN 17 6 - 20 mg/dL  ? Creatinine, Ser 1.07 (H) 0.44 - 1.00 mg/dL  ? Calcium 7.5 (L) 8.9 - 10.3 mg/dL  ? Total Protein 5.2 (L) 6.5 - 8.1 g/dL  ? Albumin 1.7 (L) 3.5 - 5.0 g/dL  ? AST 130 (H) 15 - 41 U/L  ? ALT 172 (H) 0 - 44 U/L  ? Alkaline Phosphatase 123 38 - 126 U/L  ? Total Bilirubin 0.5 0.3 - 1.2 mg/dL  ? GFR, Estimated >60 >60 mL/min  ? Anion gap 9 5 - 15  ?CBC  ? Collection Time: 03/23/22  7:54 AM  ?Result Value Ref Range  ? WBC 20.6 (H) 4.0 - 10.5 K/uL  ? RBC 5.19 (H) 3.87 - 5.11 MIL/uL  ? Hemoglobin 15.1 (H) 12.0 - 15.0 g/dL  ? HCT 43.9 36.0 - 46.0 %  ? MCV 84.6 80.0 - 100.0 fL  ? MCH 29.1 26.0 - 34.0 pg  ? MCHC 34.4 30.0 - 36.0 g/dL  ? RDW 13.9 11.5 - 15.5 %  ? Platelets 114 (L) 150 - 400 K/uL  ? nRBC 0.1 0.0 - 0.2 %  ?Comprehensive metabolic panel  ? Collection Time: 03/23/22  7:54 AM  ?Result Value Ref Range  ? Sodium 133 (L) 135 - 145 mmol/L  ? Potassium 4.5 3.5 - 5.1 mmol/L  ? Chloride 103 98 - 111 mmol/L  ? CO2 22 22 - 32 mmol/L  ? Glucose, Bld 115 (H) 70 - 99 mg/dL  ? BUN 17 6 - 20 mg/dL  ? Creatinine, Ser 1.02 (H) 0.44 - 1.00 mg/dL  ? Calcium 7.6 (L) 8.9 - 10.3 mg/dL  ? Total Protein 5.3 (L) 6.5 - 8.1 g/dL  ? Albumin 1.8 (L) 3.5 - 5.0 g/dL   ? AST 116 (H) 15 - 41 U/L  ? ALT 168 (H) 0 - 44 U/L  ? Alkaline Phosphatase 121 38 - 126 U/L  ? Total Bilirubin 0.6 0.3 - 1.2 mg/dL  ? GFR, Estimated >60 >60 mL/min  ? Anion gap 8 5 - 15  ? ? ?Imag

## 2022-03-23 NOTE — Progress Notes (Signed)
Patient ID: Brianna Gilmore, female   DOB: March 11, 1996, 26 y.o.   MRN: [id number] ? ? ?I was called to the bedside for portable ultrasound to confirm FHR and determine position to apply external fetal monitor.  RN reports FHR difficult to trace but heard intermittently with external monitor.  ? ?Limited OB US ?Date: 03/23/22 ?EDD :  06/13/22 based on LMP ?Viability:  FHT 130s visualized ?Fetal position: breech position noted with fetal spine to posterior/maternal spine, with subjectively normal anmiotic fluid. ? ? ?Pt informed that the ultrasound is considered a limited OB ultrasound and is not intended to be a complete ultrasound exam.  Patient also informed that the ultrasound is not being completed with the intent of assessing for fetal or placental anomalies or any pelvic abnormalities.  Explained that the purpose of today?s ultrasound is to assess for  viability.  Patient acknowledges the purpose of the exam and the limitations of the study.   ? ?

## 2022-03-23 NOTE — Progress Notes (Signed)
Difficulty monitoring baby due to patients habitus and position of baby.  Able to feel baby move. ?

## 2022-03-23 NOTE — Progress Notes (Signed)
?   03/23/22 1932  ?Provider Notification  ?Provider Name/Title Dr. Charlotta Newton  ?Date Provider Notified 03/23/22  ?Time Provider Notified 1932  ?Method of Notification In department  ?Request Evaluate in person  ? ?Spoke with MD concerning FHR. FHR with minimal to absent variability. MD stated pt is scheduled for c/s in the morning and she will go talk to pt. Verbal order given to IVF bolus. Will continue to assess.  ?

## 2022-03-24 ENCOUNTER — Encounter (HOSPITAL_COMMUNITY): Payer: Self-pay | Admitting: Family Medicine

## 2022-03-24 ENCOUNTER — Encounter (HOSPITAL_COMMUNITY)
Admission: AD | Disposition: A | Discharge status: Home / Self Care | Payer: Self-pay | Source: Home / Self Care | Attending: Family Medicine

## 2022-03-24 ENCOUNTER — Ambulatory Visit: Payer: Self-pay | Admitting: Obstetrics

## 2022-03-24 ENCOUNTER — Inpatient Hospital Stay (HOSPITAL_COMMUNITY): Payer: Medicaid Other | Admitting: Anesthesiology

## 2022-03-24 DIAGNOSIS — O1420 HELLP syndrome (HELLP), unspecified trimester: Secondary | ICD-10-CM | POA: Diagnosis not present

## 2022-03-24 DIAGNOSIS — O321XX0 Maternal care for breech presentation, not applicable or unspecified: Secondary | ICD-10-CM | POA: Diagnosis not present

## 2022-03-24 DIAGNOSIS — O1424 HELLP syndrome, complicating childbirth: Secondary | ICD-10-CM

## 2022-03-24 DIAGNOSIS — Z3A28 28 weeks gestation of pregnancy: Secondary | ICD-10-CM | POA: Diagnosis not present

## 2022-03-24 LAB — CBC
HCT: 40.1 % (ref 36.0–46.0)
Hemoglobin: 14.1 g/dL (ref 12.0–15.0)
MCH: 29.7 pg (ref 26.0–34.0)
MCHC: 35.2 g/dL (ref 30.0–36.0)
MCV: 84.6 fL (ref 80.0–100.0)
Platelets: 187 10*3/uL (ref 150–400)
RBC: 4.74 MIL/uL (ref 3.87–5.11)
RDW: 14.3 % (ref 11.5–15.5)
WBC: 24.6 10*3/uL — ABNORMAL HIGH (ref 4.0–10.5)
nRBC: 0.1 % (ref 0.0–0.2)

## 2022-03-24 LAB — CULTURE, BETA STREP (GROUP B ONLY)

## 2022-03-24 SURGERY — Surgical Case
Anesthesia: Spinal | Wound class: Clean Contaminated

## 2022-03-24 MED ORDER — SCOPOLAMINE 1 MG/3DAYS TD PT72
1.0000 | MEDICATED_PATCH | Freq: Once | TRANSDERMAL | Status: DC
  Administered 2022-03-24: 1.5 mg via TRANSDERMAL

## 2022-03-24 MED ORDER — MENTHOL 3 MG MT LOZG: 1.0000 | LOZENGE | OROMUCOSAL | Status: DC | PRN

## 2022-03-24 MED ORDER — MEASLES, MUMPS & RUBELLA VAC IJ SOLR: 0.5000 mL | Freq: Once | INTRAMUSCULAR | Status: DC

## 2022-03-24 MED ORDER — DIPHENHYDRAMINE HCL 25 MG PO CAPS: 25.0000 mg | ORAL_CAPSULE | ORAL | Status: DC | PRN

## 2022-03-24 MED ORDER — DIPHENHYDRAMINE HCL 50 MG/ML IJ SOLN: 12.5000 mg | INTRAMUSCULAR | Status: DC | PRN

## 2022-03-24 MED ORDER — KETOROLAC TROMETHAMINE 30 MG/ML IJ SOLN
30.0000 mg | Freq: Four times a day (QID) | INTRAMUSCULAR | Status: AC | PRN
  Administered 2022-03-24: 30 mg via INTRAMUSCULAR

## 2022-03-24 MED ORDER — OXYTOCIN-SODIUM CHLORIDE 30-0.9 UT/500ML-% IV SOLN
INTRAVENOUS | Status: DC | PRN
  Administered 2022-03-24: 200 mL via INTRAVENOUS

## 2022-03-24 MED ORDER — ENOXAPARIN SODIUM 80 MG/0.8ML IJ SOSY
70.0000 mg | PREFILLED_SYRINGE | INTRAMUSCULAR | Status: DC
  Administered 2022-03-25 – 2022-03-26 (×2): 70 mg via SUBCUTANEOUS
  Filled 2022-03-24 (×2): qty 0.8

## 2022-03-24 MED ORDER — PHENYLEPHRINE HCL-NACL 20-0.9 MG/250ML-% IV SOLN
INTRAVENOUS | Status: AC
  Filled 2022-03-24: qty 250

## 2022-03-24 MED ORDER — ONDANSETRON HCL 4 MG/2ML IJ SOLN: 4.0000 mg | Freq: Three times a day (TID) | INTRAMUSCULAR | Status: DC | PRN

## 2022-03-24 MED ORDER — SODIUM CHLORIDE 0.9 % IR SOLN
Status: DC | PRN
  Administered 2022-03-24: 1000 mL

## 2022-03-24 MED ORDER — KETOROLAC TROMETHAMINE 30 MG/ML IJ SOLN
INTRAMUSCULAR | Status: AC
  Filled 2022-03-24: qty 1

## 2022-03-24 MED ORDER — DEXAMETHASONE SODIUM PHOSPHATE 4 MG/ML IJ SOLN
INTRAMUSCULAR | Status: DC | PRN
  Administered 2022-03-24: 4 mg via INTRAVENOUS

## 2022-03-24 MED ORDER — ACETAMINOPHEN 10 MG/ML IV SOLN: 1000.0000 mg | Freq: Once | INTRAVENOUS | Status: DC | PRN

## 2022-03-24 MED ORDER — WITCH HAZEL-GLYCERIN EX PADS: 1.0000 "application " | MEDICATED_PAD | CUTANEOUS | Status: DC | PRN

## 2022-03-24 MED ORDER — FENTANYL CITRATE (PF) 100 MCG/2ML IJ SOLN
INTRAMUSCULAR | Status: DC | PRN
  Administered 2022-03-24: 50 ug via INTRAVENOUS

## 2022-03-24 MED ORDER — PHENYLEPHRINE 40 MCG/ML (10ML) SYRINGE FOR IV PUSH (FOR BLOOD PRESSURE SUPPORT)
PREFILLED_SYRINGE | INTRAVENOUS | Status: AC
  Filled 2022-03-24: qty 10

## 2022-03-24 MED ORDER — FENTANYL CITRATE (PF) 100 MCG/2ML IJ SOLN: 25.0000 ug | INTRAMUSCULAR | Status: DC | PRN

## 2022-03-24 MED ORDER — ONDANSETRON HCL 4 MG/2ML IJ SOLN
INTRAMUSCULAR | Status: DC | PRN
  Administered 2022-03-24: 4 mg via INTRAVENOUS

## 2022-03-24 MED ORDER — MORPHINE SULFATE (PF) 0.5 MG/ML IJ SOLN
INTRAMUSCULAR | Status: AC
  Filled 2022-03-24: qty 10

## 2022-03-24 MED ORDER — CLINDAMYCIN PHOSPHATE 900 MG/50ML IV SOLN
INTRAVENOUS | Status: AC
  Filled 2022-03-24: qty 50

## 2022-03-24 MED ORDER — SOD CITRATE-CITRIC ACID 500-334 MG/5ML PO SOLN: 30.0000 mL | Freq: Once | ORAL | Status: AC

## 2022-03-24 MED ORDER — GABAPENTIN 100 MG PO CAPS
200.0000 mg | ORAL_CAPSULE | Freq: Every day | ORAL | Status: DC
  Administered 2022-03-24 – 2022-03-26 (×3): 200 mg via ORAL
  Filled 2022-03-24 (×3): qty 2

## 2022-03-24 MED ORDER — IBUPROFEN 600 MG PO TABS
600.0000 mg | ORAL_TABLET | Freq: Four times a day (QID) | ORAL | Status: DC
  Administered 2022-03-25 – 2022-03-27 (×7): 600 mg via ORAL
  Filled 2022-03-24 (×7): qty 1

## 2022-03-24 MED ORDER — MEDROXYPROGESTERONE ACETATE 150 MG/ML IM SUSP: 150.0000 mg | INTRAMUSCULAR | Status: DC | PRN

## 2022-03-24 MED ORDER — LACTATED RINGERS IV SOLN: INTRAVENOUS | Status: DC

## 2022-03-24 MED ORDER — NALOXONE HCL 4 MG/10ML IJ SOLN
1.0000 ug/kg/h | INTRAVENOUS | Status: DC | PRN
  Filled 2022-03-24: qty 5

## 2022-03-24 MED ORDER — ACETAMINOPHEN 500 MG PO TABS: 1000.0000 mg | ORAL_TABLET | Freq: Four times a day (QID) | ORAL | Status: DC

## 2022-03-24 MED ORDER — DIBUCAINE (PERIANAL) 1 % EX OINT: 1.0000 "application " | TOPICAL_OINTMENT | CUTANEOUS | Status: DC | PRN

## 2022-03-24 MED ORDER — SENNOSIDES-DOCUSATE SODIUM 8.6-50 MG PO TABS
2.0000 | ORAL_TABLET | Freq: Every day | ORAL | Status: DC
  Administered 2022-03-26 – 2022-03-27 (×2): 2 via ORAL
  Filled 2022-03-24 (×2): qty 2

## 2022-03-24 MED ORDER — SCOPOLAMINE 1 MG/3DAYS TD PT72
MEDICATED_PATCH | TRANSDERMAL | Status: AC
  Filled 2022-03-24: qty 1

## 2022-03-24 MED ORDER — DEXAMETHASONE SODIUM PHOSPHATE 4 MG/ML IJ SOLN
INTRAMUSCULAR | Status: AC
  Filled 2022-03-24: qty 1

## 2022-03-24 MED ORDER — PHENYLEPHRINE HCL-NACL 20-0.9 MG/250ML-% IV SOLN
INTRAVENOUS | Status: DC | PRN
  Administered 2022-03-24: 30 ug/min via INTRAVENOUS

## 2022-03-24 MED ORDER — FUROSEMIDE 40 MG PO TABS
20.0000 mg | ORAL_TABLET | Freq: Every day | ORAL | Status: DC
  Administered 2022-03-24 – 2022-03-27 (×4): 20 mg via ORAL
  Filled 2022-03-24 (×4): qty 1

## 2022-03-24 MED ORDER — ONDANSETRON HCL 4 MG/2ML IJ SOLN
INTRAMUSCULAR | Status: AC
  Filled 2022-03-24: qty 2

## 2022-03-24 MED ORDER — ACETAMINOPHEN 500 MG PO TABS
1000.0000 mg | ORAL_TABLET | Freq: Four times a day (QID) | ORAL | Status: DC
  Administered 2022-03-25 – 2022-03-27 (×7): 1000 mg via ORAL
  Filled 2022-03-24 (×8): qty 2

## 2022-03-24 MED ORDER — TETANUS-DIPHTH-ACELL PERTUSSIS 5-2.5-18.5 LF-MCG/0.5 IM SUSY: 0.5000 mL | PREFILLED_SYRINGE | Freq: Once | INTRAMUSCULAR | Status: DC

## 2022-03-24 MED ORDER — OXYCODONE HCL 5 MG PO TABS: 5.0000 mg | ORAL_TABLET | ORAL | Status: DC | PRN

## 2022-03-24 MED ORDER — NALOXONE HCL 0.4 MG/ML IJ SOLN: 0.4000 mg | INTRAMUSCULAR | Status: DC | PRN

## 2022-03-24 MED ORDER — SODIUM CHLORIDE 0.9% FLUSH: 3.0000 mL | INTRAVENOUS | Status: DC | PRN

## 2022-03-24 MED ORDER — PRENATAL MULTIVITAMIN CH
1.0000 | ORAL_TABLET | Freq: Every day | ORAL | Status: DC
  Administered 2022-03-25 – 2022-03-27 (×3): 1 via ORAL
  Filled 2022-03-24 (×3): qty 1

## 2022-03-24 MED ORDER — PHENYLEPHRINE HCL (PRESSORS) 10 MG/ML IV SOLN
INTRAVENOUS | Status: DC | PRN
  Administered 2022-03-24: 80 ug via INTRAVENOUS
  Administered 2022-03-24: 40 ug via INTRAVENOUS

## 2022-03-24 MED ORDER — FENTANYL CITRATE (PF) 100 MCG/2ML IJ SOLN
INTRAMUSCULAR | Status: AC
  Filled 2022-03-24: qty 2

## 2022-03-24 MED ORDER — CLINDAMYCIN PHOSPHATE 900 MG/50ML IV SOLN
900.0000 mg | INTRAVENOUS | Status: AC
  Administered 2022-03-24: 900 mg via INTRAVENOUS

## 2022-03-24 MED ORDER — DEXTROSE 10 % NICU IV FLUID BOLUS: 2.0000 mL/kg | INJECTION | Freq: Once | INTRAVENOUS | Status: DC

## 2022-03-24 MED ORDER — SIMETHICONE 80 MG PO CHEW
80.0000 mg | CHEWABLE_TABLET | ORAL | Status: DC | PRN
Start: 2022-03-24 — End: 2022-03-27

## 2022-03-24 MED ORDER — MAGNESIUM HYDROXIDE 400 MG/5ML PO SUSP: 30.0000 mL | ORAL | Status: DC | PRN

## 2022-03-24 MED ORDER — DIPHENHYDRAMINE HCL 25 MG PO CAPS: 25.0000 mg | ORAL_CAPSULE | Freq: Four times a day (QID) | ORAL | Status: DC | PRN

## 2022-03-24 MED ORDER — OXYTOCIN-SODIUM CHLORIDE 30-0.9 UT/500ML-% IV SOLN
INTRAVENOUS | Status: AC
  Filled 2022-03-24: qty 500

## 2022-03-24 MED ORDER — STERILE WATER FOR IRRIGATION IR SOLN
Status: DC | PRN
  Administered 2022-03-24: 1000 mL

## 2022-03-24 MED ORDER — OXYTOCIN-SODIUM CHLORIDE 30-0.9 UT/500ML-% IV SOLN: 2.5000 [IU]/h | INTRAVENOUS | Status: AC

## 2022-03-24 MED ORDER — KETOROLAC TROMETHAMINE 30 MG/ML IJ SOLN: 30.0000 mg | Freq: Four times a day (QID) | INTRAMUSCULAR | Status: AC | PRN

## 2022-03-24 MED ORDER — COCONUT OIL OIL
1.0000 "application " | TOPICAL_OIL | Status: DC | PRN
  Administered 2022-03-24: 1 via TOPICAL

## 2022-03-24 MED ORDER — FUROSEMIDE 10 MG/ML IJ SOLN
INTRAMUSCULAR | Status: DC | PRN
  Administered 2022-03-24: 10 mg via INTRAVENOUS

## 2022-03-24 MED ORDER — MORPHINE SULFATE (PF) 0.5 MG/ML IJ SOLN
INTRAMUSCULAR | Status: DC | PRN
  Administered 2022-03-24: 150 ug via INTRATHECAL

## 2022-03-24 MED ORDER — KETOROLAC TROMETHAMINE 30 MG/ML IJ SOLN
30.0000 mg | Freq: Four times a day (QID) | INTRAMUSCULAR | Status: AC
  Administered 2022-03-25: 30 mg via INTRAVENOUS
  Filled 2022-03-24: qty 1

## 2022-03-24 MED ORDER — SIMETHICONE 80 MG PO CHEW
80.0000 mg | CHEWABLE_TABLET | Freq: Three times a day (TID) | ORAL | Status: DC
  Administered 2022-03-24 – 2022-03-26 (×7): 80 mg via ORAL
  Filled 2022-03-24 (×8): qty 1

## 2022-03-24 MED ORDER — SOD CITRATE-CITRIC ACID 500-334 MG/5ML PO SOLN
ORAL | Status: AC
  Administered 2022-03-24: 30 mL
  Filled 2022-03-24: qty 30

## 2022-03-24 MED ORDER — BUPIVACAINE IN DEXTROSE 0.75-8.25 % IT SOLN
INTRATHECAL | Status: DC | PRN
  Administered 2022-03-24: 1.8 mL via INTRATHECAL

## 2022-03-24 MED ORDER — FUROSEMIDE 10 MG/ML IJ SOLN: INTRAMUSCULAR | Status: DC | PRN

## 2022-03-24 MED ORDER — FENTANYL CITRATE (PF) 100 MCG/2ML IJ SOLN
INTRAMUSCULAR | Status: DC | PRN
  Administered 2022-03-24: 15 ug via INTRATHECAL

## 2022-03-24 MED ORDER — FENTANYL CITRATE (PF) 100 MCG/2ML IJ SOLN: INTRAMUSCULAR | Status: DC | PRN

## 2022-03-24 MED ORDER — GENTAMICIN SULFATE 40 MG/ML IJ SOLN
5.0000 mg/kg | INTRAVENOUS | Status: AC
  Administered 2022-03-24: 420 mg via INTRAVENOUS
  Filled 2022-03-24: qty 10.5

## 2022-03-24 MED ORDER — FUROSEMIDE 10 MG/ML IJ SOLN
INTRAMUSCULAR | Status: AC
  Filled 2022-03-24: qty 2

## 2022-03-24 SURGICAL SUPPLY — 43 items
BENZOIN TINCTURE PRP APPL 2/3 (GAUZE/BANDAGES/DRESSINGS) ×2 IMPLANT
CHLORAPREP W/TINT 26ML (MISCELLANEOUS) ×2 IMPLANT
CLAMP CORD UMBIL (MISCELLANEOUS) IMPLANT
CLOTH BEACON ORANGE TIMEOUT ST (SAFETY) ×2 IMPLANT
DRAPE C SECTION CLR SCREEN (DRAPES) IMPLANT
DRSG OPSITE POSTOP 4X10 (GAUZE/BANDAGES/DRESSINGS) ×2 IMPLANT
ELECT REM PT RETURN 9FT ADLT (ELECTROSURGICAL) ×2
ELECTRODE REM PT RTRN 9FT ADLT (ELECTROSURGICAL) ×1 IMPLANT
EXTRACTOR VACUUM M CUP 4 TUBE (SUCTIONS) IMPLANT
GLOVE BIO SURGEON STRL SZ7.5 (GLOVE) ×2 IMPLANT
GLOVE BIOGEL PI IND STRL 7.0 (GLOVE) ×1 IMPLANT
GLOVE BIOGEL PI INDICATOR 7.0 (GLOVE) ×1
GOWN STRL REUS W/TWL 2XL LVL3 (GOWN DISPOSABLE) ×2 IMPLANT
GOWN STRL REUS W/TWL LRG LVL3 (GOWN DISPOSABLE) ×4 IMPLANT
HEMOSTAT ARISTA ABSORB 3G PWDR (HEMOSTASIS) ×1 IMPLANT
KIT ABG SYR 3ML LUER SLIP (SYRINGE) IMPLANT
MAT PREVALON FULL STRYKER (MISCELLANEOUS) ×2 IMPLANT
NDL HYPO 25X5/8 SAFETYGLIDE (NEEDLE) IMPLANT
NEEDLE HYPO 22GX1.5 SAFETY (NEEDLE) ×2 IMPLANT
NEEDLE HYPO 25X5/8 SAFETYGLIDE (NEEDLE) IMPLANT
NS IRRIG 1000ML POUR BTL (IV SOLUTION) ×2 IMPLANT
PACK C SECTION WH (CUSTOM PROCEDURE TRAY) ×2 IMPLANT
PAD OB MATERNITY 4.3X12.25 (PERSONAL CARE ITEMS) ×2 IMPLANT
PENCIL SMOKE EVAC W/HOLSTER (ELECTROSURGICAL) ×2 IMPLANT
RETRACTOR TRAXI PANNICULUS (MISCELLANEOUS) IMPLANT
RTRCTR C-SECT PINK 25CM LRG (MISCELLANEOUS) ×2 IMPLANT
STRIP CLOSURE SKIN 1/2X4 (GAUZE/BANDAGES/DRESSINGS) ×2 IMPLANT
SUT CHROMIC 1 CTX 36 (SUTURE) ×4 IMPLANT
SUT PLAIN 0 NONE (SUTURE) IMPLANT
SUT VIC AB 1 CT1 36 (SUTURE) ×2 IMPLANT
SUT VIC AB 2-0 CT1 (SUTURE) ×2 IMPLANT
SUT VIC AB 2-0 CT1 27 (SUTURE) ×2
SUT VIC AB 2-0 CT1 TAPERPNT 27 (SUTURE) ×1 IMPLANT
SUT VIC AB 3-0 CT1 27 (SUTURE) ×2
SUT VIC AB 3-0 CT1 TAPERPNT 27 (SUTURE) ×1 IMPLANT
SUT VIC AB 3-0 SH 27 (SUTURE)
SUT VIC AB 3-0 SH 27X BRD (SUTURE) IMPLANT
SUT VIC AB 4-0 KS 27 (SUTURE) ×2 IMPLANT
SYR BULB IRRIGATION 50ML (SYRINGE) IMPLANT
TOWEL OR 17X24 6PK STRL BLUE (TOWEL DISPOSABLE) ×2 IMPLANT
TRAXI PANNICULUS RETRACTOR (MISCELLANEOUS) ×1
TRAY FOLEY W/BAG SLVR 14FR LF (SET/KITS/TRAYS/PACK) ×2 IMPLANT
WATER STERILE IRR 1000ML POUR (IV SOLUTION) ×2 IMPLANT

## 2022-03-24 NOTE — Discharge Summary (Signed)
? ?  Postpartum Discharge Summary ? ?Date of Service updated: 03/27/2022 ? ?   ?Patient Name: Brianna Gilmore ?DOB: 05/26/1996 ?MRN: [id number] ? ?Date of admission: 03/22/2022 ?Delivery date:03/24/2022  ?Delivering provider: Arlina Robes L  ?Date of discharge: 03/27/2022 ? ?Admitting diagnosis: HELLP (hemolytic anemia/elev liver enzymes/low platelets in pregnancy), antepartum [O14.20] ?Intrauterine pregnancy: [redacted]w[redacted]d    ?Secondary diagnosis:  Principal Problem: ?  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  Obesity (BMI 30-39.9) ?  Asthma ?  Fetal growth restriction antepartum ? ?Additional problems: NA    ?Discharge diagnosis: Preterm Pregnancy Delivered                                              ?Post partum procedures: magnesium x 24 hours ?Augmentation: N/A ?Complications: None ? ?Hospital course: Sceduled C/S   26y.o. yo G2P0111 at 267w3das admitted to the hospital 03/22/2022 for scheduled cesarean section with the following indication: HELLP/Breech presentation . She was admitted for 48 hours of BMZ (x2 24 hours apart) with IV Magnesium.  ?Delivery details are as follows:  ?Membrane Rupture Time/Date:  ,03/24/2022   ?Delivery Method:C-Section, Low Transverse  ?Details of operation can be found in separate operative note.  Patient had an uncomplicated postpartum course. She received magnesium x 24 hours. Procardia for BP control.   She is ambulating, tolerating a regular diet, passing flatus, and urinating well. Patient is discharged home in stable condition on  03/27/22 ?       ?Newborn Data: ?Birth date:03/24/2022  ?Birth time:12:07 PM  ?Gender:Female  ?Living status:Living  ?Apgars:3 ,3  ?Weight:640 g    ? ?Magnesium Sulfate received: Yes: Neuroprotection ?BMZ received: Yes ?Rhophylac:No ?MMR:No ?T-DaP: Offered ?Flu: No ?Transfusion:No ? ?Physical exam  ?Vitals:  ? 03/26/22 1933 03/26/22 2219 03/27/22 0414 03/27/22 0818  ?BP: (!) 144/81 132/83 133/89 (!)  141/89  ?Pulse: 72 65 (!) 59 63  ?Resp: _0 ?Temp: 98.1 ?F (36.7 ?C) 98.3 ?F (36.8 ?C) 97.6 ?F (36.4 ?C) 98.2 ?F (36.8 ?C)  ?TempSrc: Oral Oral Oral Oral  ?SpO2: 100% 99% 100% 100%  ?Weight:      ?Height:      ? ?General: alert ?Lochia: appropriate ?Uterine Fundus: firm ?Incision: Healing well with no significant drainage ?DVT Evaluation: No evidence of DVT seen on physical exam. ?Labs: ?Lab Results  ?Component Value Date  ? WBC 18.3 (H) 03/26/2022  ? HGB 12.3 03/26/2022  ? HCT 37.2 03/26/2022  ? MCV 88.6 03/26/2022  ? PLT 263 03/26/2022  ? ? ?  Latest Ref Rng & Units 03/26/2022  ?  5:14 AM  ?CMP  ?Glucose 70 - 99 mg/dL 72    ?BUN 6 - 20 mg/dL 13    ?Creatinine 0.44 - 1.00 mg/dL 0.97    ?Sodium 135 - 145 mmol/L 136    ?Potassium 3.5 - 5.1 mmol/L 4.7    ?Chloride 98 - 111 mmol/L 105    ?CO2 22 - 32 mmol/L 26    ?Calcium 8.9 - 10.3 mg/dL 6.3    ?Total Protein 6.5 - 8.1 g/dL 4.4    ?Total Bilirubin 0.3 - 1.2 mg/dL 0.7    ?Alkaline Phos 38 - 126 U/L 89    ?AST 15 - 41 U/L 55    ?ALT 0 - 44 U/L 77    ? ?  Edinburgh Score: ? ?  03/27/2022  ?  8:36 AM  ?Flavia Shipper Postnatal Depression Scale Screening Tool  ?I have been able to laugh and see the funny side of things. 0  ?I have looked forward with enjoyment to things. 3  ?I have blamed myself unnecessarily when things went wrong. 1  ?I have been anxious or worried for no good reason. 2  ?I have felt scared or panicky for no good reason. 0  ?Things have been getting on top of me. 1  ?I have been so unhappy that I have had difficulty sleeping. 0  ?I have felt sad or miserable. 1  ?I have been so unhappy that I have been crying. 0  ?The thought of harming myself has occurred to me. 1  ?Edinburgh Postnatal Depression Scale Total 9  ? ? ? ?After visit meds:  ?Allergies as of 03/27/2022   ? ?   Reactions  ? Doxycycline Shortness Of Breath  ? Unable to breathe, SOB  ? Zithromax [azithromycin] Hives  ? Penicillins Rash  ? ?  ? ?  ?Medication List  ?  ? ?STOP taking these medications    ? ?aspirin EC 81 MG tablet ?  ?Doxylamine-Pyridoxine 10-10 MG Tbec ?  ?labetalol 100 MG tablet ?Commonly known as: NORMODYNE ?  ? ?  ? ?TAKE these medications   ? ?furosemide 20 MG tablet ?Commonly known as: LASIX ?Take 1 tablet (20 mg total) by mouth daily. ?  ?ibuprofen 600 MG tablet ?Commonly known as: ADVIL ?Take 1 tablet (600 mg total) by mouth every 6 (six) hours as needed for moderate pain. ?  ?NIFEdipine 30 MG 24 hr tablet ?Commonly known as: ADALAT CC ?Take 1 tablet (30 mg total) by mouth daily. ?  ?oxyCODONE 5 MG immediate release tablet ?Commonly known as: Oxy IR/ROXICODONE ?Take 1-2 tablets (5-10 mg total) by mouth every 4 (four) hours as needed for moderate pain. ?  ?PRENATAL PO ?Take by mouth. ?  ? ?  ? ?  ?  ? ? ?  ?Durable Medical Equipment  ?(From admission, onward)  ?  ? ? ?  ? ?  Start     Ordered  ? 03/26/22 1447  For home use only DME double electric breast pump  Once       ?Comments: ICD-10 code 239.1  ? 03/26/22 1447  ? ?  ?  ? ?  ? ? ? ?Discharge home in stable condition ?Infant Feeding: Breast ?Infant Disposition:NICU ?Discharge instruction: per After Visit Summary and Postpartum booklet. ?Activity: Advance as tolerated. Pelvic rest for 6 weeks.  ?Diet: routine diet ?Future Appointments: ?Future Appointments  ?Date Time Provider [place]  ?04/06/2022  8:45 AM Norberto Sorenson, Ian Bushman, CNM [facility] None  ? ?Follow up Visit: ? Follow-up Information   ? ? [facility] Follow up.   ?Specialty: Obstetrics and Gynecology ?Contact information: ?[address] ?[place] [id number] ?[phone] ? ?  ?  ? ?  ?  ? ?  ? ? ?Patient received care at [facility]. ? ?High risk pregnancy complicated by:  HELLP ?Delivery mode:  C-Section, Low Transverse  ?Anticipated Birth Control:  Nexplanon ? ? ?03/27/2022 ?Chancy Milroy, MD ? ? ? ?

## 2022-03-24 NOTE — Progress Notes (Signed)
Patient ID: Brianna Gilmore, female   DOB: 03/28/1996, 28 y.o.   MRN: [id number] ?ACULTY PRACTICE ANTEPARTUM COMPREHENSIVE PROGRESS NOTE ? ?Brianna Gilmore is a 26 y.o. G2P0010 at [redacted]w[redacted]d  who is admitted for HELLP, abnormal doppler studies and IUGR.   ?Fetal presentation is breech. ?Length of Stay:  2  Days ? ?Subjective: ?Pt without complaints. Denies HA or blurry vision.  ?Patient reports good fetal movement.  She reports no uterine contractions, no bleeding and no loss of fluid per vagina. ? ?Vitals:  Blood pressure 138/88, pulse 93, temperature 98.1 ?F (36.7 ?C), temperature source Oral, resp. rate 20, height 5\' 3"  (1.6 m), weight 132.6 kg, last menstrual period 09/06/2021, SpO2 100 %. ? ?Physical Examination: ?Lungs clear Heart RRR ?Abd soft +BS gravid ?GU deferred ?Ext SCD's ? ?Fetal Monitoring:  Baseline: 120 bpm, Variability: Absent, Accelerations: no reactive but Cat 2 strip, and Decelerations: Absent ? ?Labs:  ?Results for orders placed or performed during the hospital encounter of 03/22/22 (from the past 24 hour(s))  ?CBC  ? Collection Time: 03/23/22 12:22 PM  ?Result Value Ref Range  ? WBC 24.1 (H) 4.0 - 10.5 K/uL  ? RBC 5.07 3.87 - 5.11 MIL/uL  ? Hemoglobin 15.0 12.0 - 15.0 g/dL  ? HCT 42.8 36.0 - 46.0 %  ? MCV 84.4 80.0 - 100.0 fL  ? MCH 29.6 26.0 - 34.0 pg  ? MCHC 35.0 30.0 - 36.0 g/dL  ? RDW 14.1 11.5 - 15.5 %  ? Platelets 147 (L) 150 - 400 K/uL  ? nRBC 0.1 0.0 - 0.2 %  ?Comprehensive metabolic panel  ? Collection Time: 03/23/22 12:22 PM  ?Result Value Ref Range  ? Sodium 133 (L) 135 - 145 mmol/L  ? Potassium 4.2 3.5 - 5.1 mmol/L  ? Chloride 104 98 - 111 mmol/L  ? CO2 21 (L) 22 - 32 mmol/L  ? Glucose, Bld 123 (H) 70 - 99 mg/dL  ? BUN 14 6 - 20 mg/dL  ? Creatinine, Ser 0.97 0.44 - 1.00 mg/dL  ? Calcium 7.3 (L) 8.9 - 10.3 mg/dL  ? Total Protein 5.5 (L) 6.5 - 8.1 g/dL  ? Albumin 1.8 (L) 3.5 - 5.0 g/dL  ? AST 99 (H) 15 - 41 U/L  ? ALT 153 (H) 0 - 44 U/L  ? Alkaline Phosphatase 126 38 - 126 U/L  ? Total  Bilirubin 0.4 0.3 - 1.2 mg/dL  ? GFR, Estimated >60 >60 mL/min  ? Anion gap 8 5 - 15  ?CBC  ? Collection Time: 03/24/22  9:35 AM  ?Result Value Ref Range  ? WBC 24.6 (H) 4.0 - 10.5 K/uL  ? RBC 4.74 3.87 - 5.11 MIL/uL  ? Hemoglobin 14.1 12.0 - 15.0 g/dL  ? HCT 40.1 36.0 - 46.0 %  ? MCV 84.6 80.0 - 100.0 fL  ? MCH 29.7 26.0 - 34.0 pg  ? MCHC 35.2 30.0 - 36.0 g/dL  ? RDW 14.3 11.5 - 15.5 %  ? Platelets 187 150 - 400 K/uL  ? nRBC 0.1 0.0 - 0.2 %  ? ? ?Imaging Studies:    ?NA  ? ?Medications:  Scheduled ? docusate sodium  100 mg Oral Daily  ? NIFEdipine  30 mg Oral BID  ? prenatal multivitamin  1 tablet Oral Q1200  ? sodium chloride flush  3 mL Intravenous Q12H  ? ?I have reviewed the patient's current medications. ? ?ASSESSMENT: ?Patient Active Problem List  ? Diagnosis Date Noted  ? Fetal growth restriction antepartum 03/23/2022  ?  HELLP (hemolytic anemia/elev liver enzymes/low platelets in pregnancy), antepartum 03/22/2022  ? Acquired pes planus of both feet 02/22/2022  ? 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 Obesity (BMI 30-39.9) 06/01/2021  ? Migraine 06/01/2021  ? Asthma 06/12/2013  ? Back pain 04/10/2013  ? ? ?PLAN: ?Stable. S/P BMZ. ?Labs improved ?For c section this morning ? ?The risks of cesarean section discussed with the patient included but were not limited to: bleeding which may require transfusion or reoperation; infection which may require antibiotics; injury to bowel, bladder, ureters or other surrounding organs; injury to the fetus; need for additional procedures including hysterectomy in the event of a life-threatening hemorrhage; placental abnormalities wth subsequent pregnancies, incisional problems, thromboembolic phenomenon and other postoperative/anesthesia complications. The patient concurred with the proposed plan, giving informed written consent for the procedure.   Anesthesia and OR aware. Preoperative prophylactic antibiotics and SCDs ordered on call to the OR.  To OR when  ready. ? ? ?Hermina Staggers ?03/24/2022,11:19 AM ? ?  ?

## 2022-03-24 NOTE — Anesthesia Procedure Notes (Signed)
Spinal ? ?Patient location during procedure: OR ?Start time: 03/24/2022 11:34 AM ?End time: 03/24/2022 11:37 AM ?Reason for block: surgical anesthesia ?Staffing ?Performed: anesthesiologist  ?Anesthesiologist: Elmer Picker, MD ?Preanesthetic Checklist ?Completed: patient identified, IV checked, risks and benefits discussed, surgical consent, monitors and equipment checked, pre-op evaluation and timeout performed ?Spinal Block ?Patient position: sitting ?Prep: DuraPrep and site prepped and draped ?Patient monitoring: cardiac monitor, continuous pulse ox and blood pressure ?Approach: midline ?Location: L3-4 ?Injection technique: single-shot ?Needle ?Needle type: Pencan  ?Needle gauge: 24 G ?Needle length: 9 cm ?Assessment ?Sensory level: T6 ?Events: CSF return ?Additional Notes ?Functioning IV was confirmed and monitors were applied. Sterile prep and drape, including hand hygiene and sterile gloves were used. The patient was positioned and the spine was prepped. The skin was anesthetized with lidocaine.  Free flow of clear CSF was obtained prior to injecting local anesthetic into the CSF.  The spinal needle aspirated freely following injection.  The needle was carefully withdrawn.  The patient tolerated the procedure well.  ? ? ? ?

## 2022-03-24 NOTE — Lactation Note (Signed)
This note was copied from a baby's chart. ? ?NICU Lactation Consultation Note ? ?Patient Name: Brianna Gilmore ?Today's Date: 03/24/2022 ?Age:28 hours ? ? ?Subjective ?Reason for consult: Initial assessment ? ?Lactation conducted initial consult with Brianna Gilmore. She is 4 hours postpartum; she has already pumped one time. I educated on pumping process and expectation for her milk volume in the first days of pumping. I answered questions.  ? ?We deferred hand expression due to maternal acuity. Follow up tomorrow recommended. ? ?I recommended that she pump q3 hours for 15-20 minutes. ? ? ? ? ?Maternal data: ?[id number]  ?C-Section, Low Transverse ? ?Current breast feeding challenges:: NICU; GHTN (Mag) ? ? ?Does the patient have breastfeeding experience prior to this delivery?: No ? ?Pumping frequency: rec q3 hours ?Pumped volume: 0 mL ? ?  ?Maternal: ?Milk volume: Normal ? ? ?Intervention/Plan ?Interventions: Breast feeding basics reviewed; "The NICU and Your Baby" book; [employer] brochure; Education ? ?Tools: Pump ?Pump Education: Setup, frequency, and cleaning ? ?Plan: ?Consult Status: Follow-up ? ?NICU Follow-up type: New admission follow up ? ? ? ?Brianna Gilmore ?03/24/2022, 5:01 PM ?

## 2022-03-24 NOTE — Transfer of Care (Signed)
Immediate Anesthesia Transfer of Care Note ? ?Patient: Brianna Gilmore ? ?Procedure(s) Performed: CESAREAN SECTION ? ?Patient Location: PACU ? ?Anesthesia Type:Spinal ? ?Level of Consciousness: awake, alert  and oriented ? ?Airway & Oxygen Therapy: Patient Spontanous Breathing ? ?Post-op Assessment: Report given to RN and Post -op Vital signs reviewed and stable ? ?Post vital signs: Reviewed and stable ? ?Last Vitals:  ?Vitals Value Taken Time  ?BP 123/89 03/24/22 1256  ?Temp    ?Pulse 61 03/24/22 1300  ?Resp 17 03/24/22 1300  ?SpO2 99 % 03/24/22 1300  ?Vitals shown include unvalidated device data. ? ?Last Pain:  ?Vitals:  ? 03/24/22 1010  ?TempSrc: Oral  ?PainSc:   ?   ? ?  ? ?Complications: No notable events documented. ?

## 2022-03-24 NOTE — Op Note (Signed)
Brianna Gilmore ?PROCEDURE DATE: 03/24/2022 ? ?PREOPERATIVE DIAGNOSIS: Intrauterine pregnancy at  [redacted]w[redacted]d weeks gestation;  HELLP syndrome, breech presentation ? ?POSTOPERATIVE DIAGNOSIS: The same ? ?PROCEDURE: Primary Low Transverse Cesarean Section ? ?SURGEON:  Dr. Nettie Elm  ? ?ASSISTANT: Dr. Leticia Penna  ? ?INDICATIONS: Brianna Gilmore is a 26 y.o. [phone] at [redacted]w[redacted]d scheduled for cesarean section secondary to malpresentation: Breech and HELLP syndrome .  The risks of cesarean section discussed with the patient included but were not limited to: bleeding which may require transfusion or reoperation; infection which may require antibiotics; injury to bowel, bladder, ureters or other surrounding organs; injury to the fetus; need for additional procedures including hysterectomy in the event of a life-threatening hemorrhage; placental abnormalities wth subsequent pregnancies, incisional problems, thromboembolic phenomenon and other postoperative/anesthesia complications. The patient concurred with the proposed plan, giving informed written consent for the procedure.   ? ?FINDINGS:  Viable female infant in breech presentation.  Apgars 3, 3, and 9, weight 640g.  Small amount of clear amniotic fluid.  Intact placenta, three vessel cord.  Normal uterus, fallopian tubes and ovaries bilaterally. ? ?ANESTHESIA:    Spinal ?INTRAVENOUS FLUIDS: 600 ml ?ESTIMATED BLOOD LOSS: 455 ml ?URINE OUTPUT:  100 ml ?SPECIMENS: Placenta sent to pathology ?COMPLICATIONS: None immediate ? ?PROCEDURE IN DETAIL:  The patient received intravenous antibiotics and had sequential compression devices applied to her lower extremities while in the preoperative area.  She was then taken to the operating room where spinal anesthesia was administered and was found to be adequate. She was then placed in a dorsal supine position with a leftward tilt, and prepped and draped in a sterile manner.  A foley catheter was placed into her bladder and attached to  constant gravity, which drained clear fluid throughout.  After an adequate timeout was performed, a Pfannenstiel skin incision was made with scalpel and carried through to the underlying layer of fascia. The fascia was incised in the midline and this incision was extended bilaterally using the Mayo scissors. Kocher clamps were applied to the superior aspect of the fascial incision and the underlying rectus muscles were dissected off bluntly. A similar process was carried out on the inferior aspect of the facial incision. The rectus muscles were separated in the midline bluntly and the peritoneum was entered bluntly. An Alexis retractor was placed to aid in visualization of the uterus.  Attention was turned to the lower uterine segment where a transverse hysterotomy was made with a scalpel and extended bilaterally bluntly. The infant was successfully delivered, and cord was clamped and cut and infant was handed over to awaiting neonatology team.  ? ?Uterine massage was then administered and the placenta delivered intact with three-vessel cord. The uterus was then cleared of clot and debris.  The hysterotomy was closed with 0 Vicryl in a running locked fashion, and an imbricating layer was also placed with a 0 Vicryl. A few figure of eight stitches were placed for hemostasis. Overall, excellent hemostasis was noted. The abdomen and the pelvis were cleared of all clot and debris and the Jon Gills was removed. Hemostasis was confirmed on all surfaces.  The peritoneum (which included some of the rectus muscles) was reapproximated using 2-0 vicryl running stitches. The fascia was then closed using 0 Vicryl in a running fashion. The subcutaneous layer was reapproximated with 0 vicryl and the skin was closed with 4-0 vicryl. The patient tolerated the procedure well. Sponge, lap, instrument and needle counts were correct x 2. She was taken to the  recovery room in stable condition.  ? ? ?Brianna Stack, DO ?03/24/2022 12:50 PM   ?

## 2022-03-24 NOTE — Anesthesia Postprocedure Evaluation (Signed)
Anesthesia Post Note ? ?Patient: Tytiona Antonacci ? ?Procedure(s) Performed: CESAREAN SECTION ? ?  ? ?Patient location during evaluation: PACU ?Anesthesia Type: Spinal ?Level of consciousness: oriented and awake and alert ?Pain management: pain level controlled ?Vital Signs Assessment: post-procedure vital signs reviewed and stable ?Respiratory status: spontaneous breathing, respiratory function stable and patient connected to nasal cannula oxygen ?Cardiovascular status: blood pressure returned to baseline and stable ?Postop Assessment: no headache, no backache and no apparent nausea or vomiting ?Anesthetic complications: no ? ? ?No notable events documented. ? ?Last Vitals:  ?Vitals:  ? 03/24/22 1400 03/24/22 1433  ?BP: 121/84 132/84  ?Pulse: 69 69  ?Resp: 20 17  ?Temp: 36.4 ?C (!) 36.2 ?C  ?SpO2: 100% 100%  ?  ?Last Pain:  ?Vitals:  ? 03/24/22 1434  ?TempSrc:   ?PainSc: 1   ? ?Pain Goal:   ? ?  ?  ?  ?  ?  ?  ?  ? ?Nain Rudd L Keylah Darwish ? ? ? ? ?

## 2022-03-24 NOTE — Anesthesia Preprocedure Evaluation (Signed)
Anesthesia Evaluation  ?Patient identified by MRN, date of birth, ID band ?Patient awake ? ? ? ?Reviewed: ?Allergy & Precautions, NPO status , Patient's Chart, lab work & pertinent test results, reviewed documented beta blocker date and time  ? ?Airway ?Mallampati: III ? ?TM Distance: >3 FB ?Neck ROM: Full ? ? ? Dental ?no notable dental hx. ? ?  ?Pulmonary ?asthma ,  ?  ?Pulmonary exam normal ?breath sounds clear to auscultation ? ? ? ? ? ? Cardiovascular ?hypertension, Pt. on home beta blockers and Pt. on medications ?Normal cardiovascular exam ?Rhythm:Regular Rate:Normal ? ? ?  ?Neuro/Psych ? Headaches, negative psych ROS  ? GI/Hepatic ?negative GI ROS, Neg liver ROS,   ?Endo/Other  ?Morbid obesity (BMI 52) ? Renal/GU ?negative Renal ROS  ?negative genitourinary ?  ?Musculoskeletal ?negative musculoskeletal ROS ?(+)  ? Abdominal ?  ?Peds ? Hematology ?negative hematology ROS ?(+)   ?Anesthesia Other Findings ?Primary C/S at [redacted] weeks gestation for HELLP, plt 187 today ? Reproductive/Obstetrics ?(+) Pregnancy ? ?  ? ? ? ? ? ? ? ? ? ? ? ? ? ?  ?  ? ? ? ? ? ? ? ? ?Anesthesia Physical ?Anesthesia Plan ? ?ASA: 3 ? ?Anesthesia Plan: Spinal  ? ?Post-op Pain Management:   ? ?Induction:  ? ?PONV Risk Score and Plan: Treatment may vary due to age or medical condition ? ?Airway Management Planned: Natural Airway ? ?Additional Equipment:  ? ?Intra-op Plan:  ? ?Post-operative Plan:  ? ?Informed Consent: I have reviewed the patients History and Physical, chart, labs and discussed the procedure including the risks, benefits and alternatives for the proposed anesthesia with the patient or authorized representative who has indicated his/her understanding and acceptance.  ? ? ? ?Dental advisory given ? ?Plan Discussed with: CRNA ? ?Anesthesia Plan Comments:   ? ? ? ? ? ? ?Anesthesia Quick Evaluation ? ?

## 2022-03-25 LAB — COMPREHENSIVE METABOLIC PANEL
ALT: 105 U/L — ABNORMAL HIGH (ref 0–44)
AST: 75 U/L — ABNORMAL HIGH (ref 15–41)
Albumin: 1.9 g/dL — ABNORMAL LOW (ref 3.5–5.0)
Alkaline Phosphatase: 114 U/L (ref 38–126)
Anion gap: 6 (ref 5–15)
BUN: 15 mg/dL (ref 6–20)
CO2: 26 mmol/L (ref 22–32)
Calcium: 6 mg/dL — CL (ref 8.9–10.3)
Chloride: 104 mmol/L (ref 98–111)
Creatinine, Ser: 0.95 mg/dL (ref 0.44–1.00)
GFR, Estimated: >60 mL/min (ref >60)
Glucose, Bld: 81 mg/dL (ref 70–99)
Potassium: 4.8 mmol/L (ref 3.5–5.1)
Sodium: 136 mmol/L (ref 135–145)
Total Bilirubin: 0.5 mg/dL (ref 0.3–1.2)
Total Protein: 5 g/dL — ABNORMAL LOW (ref 6.5–8.1)

## 2022-03-25 LAB — CBC
HCT: 42.1 % (ref 36.0–46.0)
Hemoglobin: 14.4 g/dL (ref 12.0–15.0)
MCH: 29.6 pg (ref 26.0–34.0)
MCHC: 34.2 g/dL (ref 30.0–36.0)
MCV: 86.4 fL (ref 80.0–100.0)
Platelets: 297 10*3/uL (ref 150–400)
RBC: 4.87 MIL/uL (ref 3.87–5.11)
RDW: 14.4 % (ref 11.5–15.5)
WBC: 29.7 10*3/uL — ABNORMAL HIGH (ref 4.0–10.5)
nRBC: 0.1 % (ref 0.0–0.2)

## 2022-03-25 NOTE — Lactation Note (Signed)
This note was copied from a baby's chart. ? ?NICU Lactation Consultation Note ? ?Patient Name: Brianna Gilmore ?Today's Date: 03/25/2022 ?Age:28 hours ? ? ?Subjective ?Reason for consult: Follow-up assessment ?Mother is pumping q3. She denies difficulty or discomfort. Mother requests a [facility] referral for at-home pump. ? ?Objective ?Maternal data: ?[id number]  ?C-Section, Low Transverse ? ?Current breast feeding challenges:: NICU; GHTN (Mag) ? ?Does the patient have breastfeeding experience prior to this delivery?: No ? ?Pumping frequency: q3 ?Pumped volume: 0 mL ? ? ?WIC Referral Sent?: Yes ? ?Assessment ?Maternal: ?Milk volume: Normal ? ? ?Intervention/Plan ?Interventions: Education ? ?Tools: Pump ?Pump Education: Setup, frequency, and cleaning ? ?Plan: ?Consult Status: Follow-up ? ?NICU Follow-up type: New admission follow up; Maternal D/C visit; Verify onset of copious milk; Weekly NICU follow up ? ?Mother to continue pumping q3 ? ?Elder Negus ?03/25/2022, 2:30 PM ?

## 2022-03-25 NOTE — Progress Notes (Signed)
Subjective: ?Postpartum Day 1: Cesarean Delivery ?Patient reports nausea, vomiting, and incisional pain.   ? ?Objective: ?Vital signs in last 24 hours: ?Temp:  [97.1 ?F (36.2 ?C)-98.2 ?F (36.8 ?C)] 98.2 ?F (36.8 ?C) (04/07 0831) ?Pulse Rate:  [56-93] 72 (04/07 0831) ?Resp:  [13-26] 18 (04/07 0831) ?BP: (119-138)/(79-90) 131/81 (04/07 0831) ?SpO2:  [93 %-100 %] 99 % (04/07 0831) ? ?Vitals:  ? 03/25/22 0200 03/25/22 0300 03/25/22 0419 03/25/22 0831  ?BP:   132/88 131/81  ?Pulse:   63 72  ?Resp: 16 16 16 18   ?Temp:   97.7 ?F (36.5 ?C) 98.2 ?F (36.8 ?C)  ?TempSrc:   Oral Oral  ?SpO2:   98% 99%  ?Weight:      ?Height:      ? ? ?Intake/Output Summary (Last 24 hours) at 03/25/2022 0842 ?Last data filed at 03/25/2022 05/25/2022 ?Gross per 24 hour  ?Intake 3355.28 ml  ?Output 4430 ml  ?Net -1074.72 ml  ? ? ?Physical Exam:  ?General: alert, cooperative, and appears stated age ?Lochia: appropriate ?Uterine Fundus: firm ?Incision: no significant drainage ?DVT Evaluation: No evidence of DVT seen on physical exam. ? ?Results for orders placed or performed during the hospital encounter of 03/22/22 (from the past 24 hour(s))  ?CBC     Status: Abnormal  ? Collection Time: 03/24/22  9:35 AM  ?Result Value Ref Range  ? WBC 24.6 (H) 4.0 - 10.5 K/uL  ? RBC 4.74 3.87 - 5.11 MIL/uL  ? Hemoglobin 14.1 12.0 - 15.0 g/dL  ? HCT 40.1 36.0 - 46.0 %  ? MCV 84.6 80.0 - 100.0 fL  ? MCH 29.7 26.0 - 34.0 pg  ? MCHC 35.2 30.0 - 36.0 g/dL  ? RDW 14.3 11.5 - 15.5 %  ? Platelets 187 150 - 400 K/uL  ? nRBC 0.1 0.0 - 0.2 %  ?CBC     Status: Abnormal  ? Collection Time: 03/25/22  5:11 AM  ?Result Value Ref Range  ? WBC 29.7 (H) 4.0 - 10.5 K/uL  ? RBC 4.87 3.87 - 5.11 MIL/uL  ? Hemoglobin 14.4 12.0 - 15.0 g/dL  ? HCT 42.1 36.0 - 46.0 %  ? MCV 86.4 80.0 - 100.0 fL  ? MCH 29.6 26.0 - 34.0 pg  ? MCHC 34.2 30.0 - 36.0 g/dL  ? RDW 14.4 11.5 - 15.5 %  ? Platelets 297 150 - 400 K/uL  ? nRBC 0.1 0.0 - 0.2 %  ?Comprehensive metabolic panel     Status: Abnormal  ? Collection  Time: 03/25/22  5:11 AM  ?Result Value Ref Range  ? Sodium 136 135 - 145 mmol/L  ? Potassium 4.8 3.5 - 5.1 mmol/L  ? Chloride 104 98 - 111 mmol/L  ? CO2 26 22 - 32 mmol/L  ? Glucose, Bld 81 70 - 99 mg/dL  ? BUN 15 6 - 20 mg/dL  ? Creatinine, Ser 0.95 0.44 - 1.00 mg/dL  ? Calcium 6.0 (LL) 8.9 - 10.3 mg/dL  ? Total Protein 5.0 (L) 6.5 - 8.1 g/dL  ? Albumin 1.9 (L) 3.5 - 5.0 g/dL  ? AST 75 (H) 15 - 41 U/L  ? ALT 105 (H) 0 - 44 U/L  ? Alkaline Phosphatase 114 38 - 126 U/L  ? Total Bilirubin 0.5 0.3 - 1.2 mg/dL  ? GFR, Estimated >60 >60 mL/min  ? Anion gap 6 5 - 15  ? ? ? ?Assessment/Plan: ?Status post Cesarean section. Doing well postoperatively.  ?Magnesium x 24 hours ?Lasix ?Procardia - BP  is well controlled ?Labs are improving ?Continue postop care ?Ambulate today ? ?Brianna Gilmore ?03/25/2022, 8:40 AM ? ? ?

## 2022-03-26 LAB — TYPE AND SCREEN
ABO/RH(D): B POS
Antibody Screen: NEGATIVE
Unit division: 0
Unit division: 0
Unit division: 0
Unit division: 0

## 2022-03-26 LAB — BPAM RBC
Blood Product Expiration Date: 202304282359
Blood Product Expiration Date: 202304282359
Blood Product Expiration Date: 202304292359
Blood Product Expiration Date: 202304292359
Unit Type and Rh: 7300
Unit Type and Rh: 7300
Unit Type and Rh: 7300
Unit Type and Rh: 7300

## 2022-03-26 LAB — COMPREHENSIVE METABOLIC PANEL
ALT: 77 U/L — ABNORMAL HIGH (ref 0–44)
AST: 55 U/L — ABNORMAL HIGH (ref 15–41)
Albumin: 1.7 g/dL — ABNORMAL LOW (ref 3.5–5.0)
Alkaline Phosphatase: 89 U/L (ref 38–126)
Anion gap: 5 (ref 5–15)
BUN: 13 mg/dL (ref 6–20)
CO2: 26 mmol/L (ref 22–32)
Calcium: 6.3 mg/dL — CL (ref 8.9–10.3)
Chloride: 105 mmol/L (ref 98–111)
Creatinine, Ser: 0.97 mg/dL (ref 0.44–1.00)
GFR, Estimated: >60 mL/min (ref >60)
Glucose, Bld: 72 mg/dL (ref 70–99)
Potassium: 4.7 mmol/L (ref 3.5–5.1)
Sodium: 136 mmol/L (ref 135–145)
Total Bilirubin: 0.7 mg/dL (ref 0.3–1.2)
Total Protein: 4.4 g/dL — ABNORMAL LOW (ref 6.5–8.1)

## 2022-03-26 LAB — CBC
HCT: 37.2 % (ref 36.0–46.0)
Hemoglobin: 12.3 g/dL (ref 12.0–15.0)
MCH: 29.3 pg (ref 26.0–34.0)
MCHC: 33.1 g/dL (ref 30.0–36.0)
MCV: 88.6 fL (ref 80.0–100.0)
Platelets: 263 10*3/uL (ref 150–400)
RBC: 4.2 MIL/uL (ref 3.87–5.11)
RDW: 14.6 % (ref 11.5–15.5)
WBC: 18.3 10*3/uL — ABNORMAL HIGH (ref 4.0–10.5)
nRBC: 0 % (ref 0.0–0.2)

## 2022-03-26 NOTE — Lactation Note (Signed)
This note was copied from a baby's chart. ?Lactation Consultation Note ? ?Patient Name: Brianna Gilmore ?Today's Date: 03/26/2022 ?Reason for consult: Follow-up assessment;Infant < 6lbs;NICU baby;1st time breastfeeding;Primapara;Other (Comment);Preterm <34wks (SGA, HELLP syndrome) ?Age:28 hours ? ?Visited with mom of 12 hours old pre-term NICU female, she's a P1 and reports she's been pumping about 3 times/day and not getting anything other than drops. Reviewed pumping schedule, expectations, lactogenesis II and benefits of premature milk for NICU babies. Explained to Brianna Gilmore the importance of consistent pumping for the onset of lactogenesis II and to protect her supply, she voiced understanding.  ? ?She might be getting discharged tomorrow. Previous LC sent a [facility] referral already, let Brianna Gilmore know about the [employer] loaner option at the hospital since tomorrow will be Sunday.  ? ?Maternal Data ? Mom's supply is WNL ? ?Feeding ?Mother's Current Feeding Choice: Breast Milk ? ?Lactation Tools Discussed/Used ?Tools: Pump;Coconut oil ?Breast pump type: Double-Electric Breast Pump ?Pump Education: Setup, frequency, and cleaning;Milk Storage ?Reason for Pumping: pre-term in NICU ?Pumping frequency: 3 times/24 hours ?Pumped volume:  (drops) ? ?Interventions ?Interventions: DEBP;Education;Coconut oil ? ?Plan of care ?Encouraged mom to start pumping consistently every 3 hours, at least 8 pumping sessions/24 hours ?Hand expression, breast massage and coconut oil were also encouraged prior pumping ? ?GOB (maternal side) present and supportive. All questions and concerns answered, family to contact lactation services PRN. ? ?Discharge ?Pump: DEBP ? ?Consult Status ?Consult Status: Follow-up ?Date: 03/26/22 ?Follow-up type: In-patient ? ? ?Brianna Gilmore S Brianna Gilmore ?03/26/2022, 4:12 PM ? ? ? ?

## 2022-03-26 NOTE — Progress Notes (Signed)
Subjective: ?Postpartum Day 2: Cesarean Delivery at 28 weeks due to HELLP, malpresentation and IUGR ?Patient reports doing well. Pain controlled. Tolerating diet. Ambulating and voiding without problems.  ? ?Infant stable in NICU per mother's report ? ?Objective: ?Vital signs in last 24 hours: ?Temp:  [97.7 ?F (36.5 ?C)-98.3 ?F (36.8 ?C)] 98.2 ?F (36.8 ?C) (04/08 [id number]) ?Pulse Rate:  [69-75] 75 (04/08 0616) ?Resp:  [16-18] 18 (04/08 [id number]) ?BP: (132-142)/(76-98) 142/98 (04/08 [id number]) ?SpO2:  [99 %-100 %] 99 % (04/08 0616) ? ?Physical Exam:  ?General: alert ?Lochia: appropriate ?Uterine Fundus: firm ?Incision: healing well ?DVT Evaluation: No evidence of DVT seen on physical exam. ? ?Recent Labs  ?  03/25/22 ?[id number] 03/26/22 ?[id number]  ?HGB 14.4 12.3  ?HCT 42.1 37.2  ? ? ?Assessment/Plan: ?Status post Cesarean section. Doing well postoperatively.  ?Continue current care. ? ?Chancy Milroy ?03/26/2022, 10:29 AM ? ? ?

## 2022-03-26 NOTE — Progress Notes (Signed)
Spoke with MD regarding FHR tracing. FHR with absent to minimal variability. MD stated pt. Was scheduled for C-Section on 4/6.  Will continue to assess. ?

## 2022-03-27 MED ORDER — NIFEDIPINE ER OSMOTIC RELEASE 30 MG PO TB24
30.0000 mg | ORAL_TABLET | Freq: Every day | ORAL | Status: DC
  Administered 2022-03-27: 30 mg via ORAL
  Filled 2022-03-27: qty 1

## 2022-03-27 MED ORDER — FUROSEMIDE 20 MG PO TABS: 20.0000 mg | ORAL_TABLET | Freq: Every day | ORAL | 0 refills | Status: DC

## 2022-03-27 MED ORDER — OXYCODONE HCL 5 MG PO TABS: 5.0000 mg | ORAL_TABLET | ORAL | 0 refills | Status: DC | PRN

## 2022-03-27 MED ORDER — IBUPROFEN 600 MG PO TABS: 600.0000 mg | ORAL_TABLET | Freq: Four times a day (QID) | ORAL | 1 refills | Status: DC | PRN

## 2022-03-27 MED ORDER — NIFEDIPINE ER 30 MG PO TB24: 30.0000 mg | ORAL_TABLET | Freq: Every day | ORAL | 1 refills | Status: DC

## 2022-03-27 NOTE — Lactation Note (Signed)
This note was copied from a baby's chart. ? ?NICU Lactation Consultation Note ? ?Patient Name: Brianna Gilmore ?Today's Date: 03/27/2022 ?Age:28 hours ? ? ?Subjective ?Reason for consult: Follow-up assessment; Mother's request; NICU baby; Preterm <34wks; 1st time breastfeeding; Primapara ? ?Lactation followed up with Ms. Mancel Bale. I provided her with a [facility] loaner pump. She is being discharged today (weekend). She states that she will be following up with [facility] soon. We tested her loaner pump to make sure it works properly. ? ?I reviewed pumping basics. I encouraged her to pump 8+ times a day with an average of q2-3 hours during the day and q3-4 hours at night. She asked if she could wait up to five hours at night, and we weighed the options. I recommended she try to keep her window to four hours, if possible, but to listen to her body regarding sleep and her own personal recovery (and sleep longer stretches, as needed). ? ?Ms. Oleson is seeing some increase in volume. I reviewed pumping basics at 73 hours. She has labels and storage bottles. I recommended that she label each pumping session in a separate bottle. We discussed milk storage guidelines and transport of her EBM to the NICU from home. ? ?Objective ?Infant data: ?Mother's Current Feeding Choice: Breast Milk and Donor Milk ? ?Maternal data: ?[id number]  ?C-Section, Low Transverse ? ?Current breast feeding challenges:: NICU; separation ? ?Does the patient have breastfeeding experience prior to this delivery?: No ? ?Pumping frequency: q2-3 hours day; q 4-5 hours night ?Pumped volume: 10 mL ? ? ?WIC Program: Yes ?[place] Referral Sent?: Yes ?Pump: WIC Loaner ? ? ?Maternal: ?Milk volume: Normal ? ? ?Intervention/Plan ?Interventions: Breast feeding basics reviewed; Education; DEBP; [employer] brochure ? ?Tools: Pump ?Pump Education: Setup, frequency, and cleaning ? ?Plan: ?Consult Status: Follow-up ? ?NICU Follow-up type: New admission follow up; Verify onset of  copious milk; Verify absence of engorgement ? ? ? ?Lenore Manner ?03/27/2022, 1:15 PM ?

## 2022-03-29 LAB — SURGICAL PATHOLOGY

## 2022-03-31 ENCOUNTER — Ambulatory Visit (INDEPENDENT_AMBULATORY_CARE_PROVIDER_SITE_OTHER): Payer: Self-pay

## 2022-03-31 ENCOUNTER — Encounter: Payer: Self-pay | Admitting: Family Medicine

## 2022-03-31 VITALS — BP 133/87 | HR 75 | Wt 269.9 lb

## 2022-03-31 DIAGNOSIS — Z0130 Encounter for examination of blood pressure without abnormal findings: Secondary | ICD-10-CM

## 2022-03-31 NOTE — Progress Notes (Signed)
Blood Pressure Check Visit ? ?Brianna Gilmore is here for blood pressure check following c-section on 03/24/22. Incision is open to air and appears clean, dry, and intact on assessment. Reviewed s/s of infection and good wound care. BP today is 133/87. Patient reports taking nifedipine 30 mg daily usually around 11 AM - 1 PM. Reports headache and blurry vision in evening the last few days. Bilateral lower leg pitting edema, with more swelling of right leg. No calf pain. Patient has a few more days of Lasix course prescribed at discharge. Reviewed with Para March, MD who states patient may continue current medication and monitor swelling at home. Patient to report any new symptoms. Pt to return in 1 week for recheck of BP. ? ?[name], RN ?03/31/2022  8:47 AM  ?

## 2022-04-04 ENCOUNTER — Ambulatory Visit: Payer: Self-pay

## 2022-04-06 ENCOUNTER — Encounter: Payer: Self-pay | Admitting: Obstetrics

## 2022-04-08 ENCOUNTER — Encounter: Payer: Self-pay | Admitting: *Deleted

## 2022-04-08 ENCOUNTER — Ambulatory Visit (INDEPENDENT_AMBULATORY_CARE_PROVIDER_SITE_OTHER): Payer: Self-pay | Admitting: *Deleted

## 2022-04-08 VITALS — BP 125/86 | HR 79 | Ht 63.0 in | Wt 262.1 lb

## 2022-04-08 DIAGNOSIS — Z0130 Encounter for examination of blood pressure without abnormal findings: Secondary | ICD-10-CM

## 2022-04-08 NOTE — Progress Notes (Signed)
Pt presents for blood pressure check. BP 125/86, P - 79.  Pt reports having several mild H/A's daily which last for a short time (1-2 hours).  She does not take medication for the H/A's. She denies visual disturbances. Pt was advised to continue taking Nifedipine as prescribed and check BP @ home several times per week. If she develops BP>160-100, severe H/A or visual disturbances, she should go to [facility] for evaluation. Pt had C/S on 4/6 and requested incision check today (previously done on 4/13) as she has noticed a "knot" above incision. The operative site was examined and found to be well healed and no drainage, redness or swelling was observed. There was a slightly firm area palpated 0.5 inch above Lt side of incision. She denied pain @ the site. Pt was advised this likely represents scar tissue and is normal.  She was advised to keep office appt for PP exam on 5/11 @ 0915. Pt voiced understanding of all information and instructions given.  ?

## 2022-04-28 ENCOUNTER — Ambulatory Visit (INDEPENDENT_AMBULATORY_CARE_PROVIDER_SITE_OTHER): Payer: Medicaid Other | Admitting: Obstetrics and Gynecology

## 2022-04-28 ENCOUNTER — Encounter: Payer: Self-pay | Admitting: Obstetrics and Gynecology

## 2022-04-28 VITALS — BP 122/83 | HR 70 | Ht 63.0 in | Wt 265.4 lb

## 2022-04-28 DIAGNOSIS — O1420 HELLP syndrome (HELLP), unspecified trimester: Secondary | ICD-10-CM

## 2022-04-28 NOTE — Patient Instructions (Signed)

## 2022-04-28 NOTE — Progress Notes (Signed)
? ? ?  Post Partum Visit Note ? ?Brianna Gilmore is a 26 y.o. [phone] female who presents for a postpartum visit. She is 5 weeks postpartum following a primary cesarean section.  I have fully reviewed the prenatal and intrapartum course. The delivery was at 28.3 gestational weeks.  Anesthesia: spinal. Postpartum course has been unremarkable. Baby is doing well overall, despite prematurity. Currently at [facility] due to prematurity . Baby is feeding by  breast pumping . Bleeding no bleeding. Bowel function is normal. Bladder function is normal. Patient is not sexually active. Contraception method desires Nexplanon.  Postpartum depression screening: negative. ? ? ?The pregnancy intention screening data noted above was reviewed. Potential methods of contraception were discussed. The patient elected to proceed with No data recorded. ? ? ? ?Health Maintenance Due  ?Topic Date Due  ? COVID-19 Vaccine (3 - Booster for Pfizer series) 01/28/2021  ? ? ?The following portions of the patient's history were reviewed and updated as appropriate: allergies, current medications, past family history, past medical history, past social history, past surgical history, and problem list. ? ?Review of Systems ?Pertinent items noted in HPI and remainder of comprehensive ROS otherwise negative. ? ?Objective:  ?LMP 09/06/2021 (Exact Date)   ? ?General:  alert  ? Breasts:  not indicated  ?Lungs: clear to auscultation bilaterally  ?Heart:  regular rate and rhythm, S1, S2 normal, no murmur, click, rub or gallop  ?Abdomen: soft, non-tender; bowel sounds normal; no masses,  no organomegaly   ?Wound well approximated incision  ?GU exam:  not indicated  ?     ?Assessment:  ? ? There are no diagnoses linked to this encounter. ? ?Nl postpartum exam.  ?H/O HELLP ?Plan:  ? ?Essential components of care per ACOG recommendations: ? ?1.  Mood and well being: Patient with negative depression screening today. Reviewed local resources for support.  ?- Patient tobacco  use? No.   ?- hx of drug use? No.   ? ?2. Infant care and feeding:  ?-Patient currently breastmilk feeding? Breast pumping ?-Social determinants of health (SDOH) reviewed in EPIC. No concerns ? ?3. Sexuality, contraception and birth spacing ?- Patient does not want a pregnancy in the next year.  Desired family size is uncertain  ?- Reviewed reproductive life planning. Reviewed contraceptive methods based on pt preferences and effectiveness.  Patient desired Hormonal Implant. Waiting on insurance before placement ?- Discussed birth spacing of 18 months ? ?4. Sleep and fatigue ?-Encouraged family/partner/community support of 4 hrs of uninterrupted sleep to help with mood and fatigue ? ?5. Physical Recovery  ?- Discussed patients delivery and complications. She describes her labor as good. ?- Patient had a C-section. Patient expressed understanding ?- Patient has urinary incontinence? No. ?- Patient is safe to resume physical and sexual activity ? ?6.  Health Maintenance ?- HM due items addressed Yes ?- Last pap smear No results found for: DIAGPAP Pap smear not done at today's visit.  ?-Breast Cancer screening indicated? No.  ? ?7. Chronic Disease/Pregnancy Condition follow up: None ? ?- PCP follow up ? ?Will stop Procardia ?BP check in 2 weeks with nurse visit ?Will check CMP today d/t H/O HELLP ?Will return in 6 weeks for Nexplanon placement ? ? ?Nettie Elm, MD ?Center for [facility], [facility] ? ?

## 2022-04-29 LAB — COMPREHENSIVE METABOLIC PANEL
ALT: 16 IU/L (ref 0–32)
AST: 17 IU/L (ref 0–40)
Albumin/Globulin Ratio: 1.4 (ref 1.2–2.2)
Albumin: 3.7 g/dL — ABNORMAL LOW (ref 3.9–5.0)
Alkaline Phosphatase: 89 IU/L (ref 44–121)
BUN/Creatinine Ratio: 10 (ref 9–23)
BUN: 10 mg/dL (ref 6–20)
Bilirubin Total: <0.2 mg/dL (ref 0.0–1.2)
CO2: 22 mmol/L (ref 20–29)
Calcium: 9.5 mg/dL (ref 8.7–10.2)
Chloride: 102 mmol/L (ref 96–106)
Creatinine, Ser: 0.99 mg/dL (ref 0.57–1.00)
Globulin, Total: 2.6 g/dL (ref 1.5–4.5)
Glucose: 87 mg/dL (ref 70–99)
Potassium: 4.5 mmol/L (ref 3.5–5.2)
Sodium: 142 mmol/L (ref 134–144)
Total Protein: 6.3 g/dL (ref 6.0–8.5)
eGFR: 80 mL/min/{1.73_m2} (ref >59)

## 2022-05-13 ENCOUNTER — Ambulatory Visit (INDEPENDENT_AMBULATORY_CARE_PROVIDER_SITE_OTHER): Payer: Self-pay | Admitting: *Deleted

## 2022-05-13 VITALS — BP 125/81 | HR 70 | Ht 64.0 in | Wt 267.2 lb

## 2022-05-13 DIAGNOSIS — Z0130 Encounter for examination of blood pressure without abnormal findings: Secondary | ICD-10-CM

## 2022-05-13 NOTE — Progress Notes (Signed)
Here for bp check s/p C/S 03/24/22, had HELLP. At postpartum visit 04/28/22 was told to stop procardia and return bp check in 2 weeks. BP today 125/81. Reviewed S/S of HELLP / PIH/ when to return to hospital. She voices understanding. Nancy Fetter

## 2022-05-13 NOTE — Progress Notes (Signed)
Chart reviewed for nurse visit. Agree with plan of care.   Clarnce Flock, MD 05/13/22 12:22 PM

## 2022-06-06 ENCOUNTER — Encounter: Payer: BC Managed Care – PPO | Admitting: Certified Nurse Midwife

## 2022-06-24 ENCOUNTER — Encounter: Payer: Self-pay | Admitting: Obstetrics and Gynecology

## 2022-08-02 ENCOUNTER — Ambulatory Visit
Admission: EM | Admit: 2022-08-02 | Discharge: 2022-08-02 | Disposition: A | Discharge status: Home / Self Care | Payer: Medicaid Other | Attending: Family Medicine | Admitting: Family Medicine

## 2022-08-02 ENCOUNTER — Encounter: Payer: Self-pay | Admitting: Emergency Medicine

## 2022-08-02 ENCOUNTER — Other Ambulatory Visit: Payer: Self-pay

## 2022-08-02 DIAGNOSIS — H66002 Acute suppurative otitis media without spontaneous rupture of ear drum, left ear: Secondary | ICD-10-CM

## 2022-08-02 DIAGNOSIS — U071 COVID-19: Secondary | ICD-10-CM | POA: Diagnosis not present

## 2022-08-02 DIAGNOSIS — J069 Acute upper respiratory infection, unspecified: Secondary | ICD-10-CM

## 2022-08-02 LAB — RESP PANEL BY RT-PCR (FLU A&B, COVID) ARPGX2
Influenza A by PCR: NEGATIVE
Influenza B by PCR: NEGATIVE
SARS Coronavirus 2 by RT PCR: POSITIVE — AB

## 2022-08-02 MED ORDER — OFLOXACIN 0.3 % OT SOLN: 3.0000 [drp] | Freq: Two times a day (BID) | OTIC | 0 refills | Status: AC

## 2022-08-02 MED ORDER — MUCINEX MAXIMUM STRENGTH 1200 MG PO TB12: 1.0000 | ORAL_TABLET | Freq: Two times a day (BID) | ORAL | 1 refills | Status: DC | PRN

## 2022-08-02 NOTE — ED Provider Notes (Signed)
Renaldo Fiddler    CSN: [id number] Arrival date & time: 08/02/22  1045      History   Chief Complaint Chief Complaint  Patient presents with   Ear Drainage    Low grade fever, post nasal drip - Entered by patient   Ear Fullness    HPI Brianna Gilmore is a 26 y.o. female.   HPI Patient presents today for evaluation of URI symptoms for cough, nasal congestion, fever (x 1 day-TMAX 100), bilateral ear discomfort with drainage from right ear. She denies any chest tightness. She has been exposed to her significant other who is experiencing similar symptoms but has not tested positive for COVID. Denies any shortness of breath, weakness, or chest tightness. 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 Authorizing Provider  ibuprofen (ADVIL) 600 MG tablet Take 1 tablet (600 mg total) by mouth every 6 (six) hours as needed for moderate pain. 03/27/22   Hermina Staggers, MD  NIFEdipine  (ADALAT CC) 30 MG 24 hr tablet Take 1 tablet (30 mg total) by mouth daily. Patient not taking: Reported on 05/13/2022 03/27/22   Hermina Staggers, MD  Prenatal Vit-Fe Fumarate-FA (PRENATAL PO) Take by mouth.    [provider]    Family History Family History  Problem Relation Age of Onset   Diabetes Maternal Grandmother    Hypertension Maternal Grandmother     Social History Social History   Tobacco Use   Smoking status: Never   Smokeless tobacco: Never  Vaping Use   Vaping Use: Never used  Substance Use Topics   Alcohol use: No   Drug use: Not Currently     Allergies   Doxycycline, Zithromax [azithromycin], and Penicillins   Review of Systems Review of Systems Pertinent negatives listed in HPI  Physical Exam Triage Vital Signs ED Triage Vitals  Enc Vitals Group     BP 08/02/22 1057 129/85     Pulse Rate 08/02/22 1057 85     Resp 08/02/22 1057 18     Temp 08/02/22 1057 99.2 F (37.3 C)     Temp Source 08/02/22 1057 Oral     SpO2 08/02/22  1057 97 %     Weight --      Height --      Head Circumference --      Peak Flow --      Pain Score 08/02/22 1109 0     Pain Loc --      Pain Edu? --      Excl. in GC? --    No data found.  Updated Vital Signs BP 129/85 (BP Location: Left Arm)   Pulse 85   Temp 99.2 F (37.3 C) (Oral)   Resp 18   LMP 07/12/2022 (Approximate)   SpO2 97%   Breastfeeding Yes   Visual Acuity Right Eye Distance:   Left Eye Distance:   Bilateral Distance:    Right Eye Near:   Left Eye Near:    Bilateral Near:     Physical Exam   General Appearance:    Alert, cooperative, no distress  HENT:  Normocephalic, Right TM normal, Left TM and canal erythematous with MEE, nares mucosal edema with congestion, rhinorrhea, oropharynx patent w/o erythema or swelling  Eyes:    PERRL, conjunctiva/corneas clear, EOM's intact       Lungs:     Clear to auscultation bilaterally, respirations unlabored  Heart:    Regular rate and rhythm   Neurologic:   Awake, alert, oriented x 3. No apparent focal neurological           defect.     UC Treatments / Results  Labs (all labs ordered are listed, but only abnormal results are displayed) Labs Reviewed - No data to display  EKG   Radiology No results found.  Procedures Procedures (including critical care time)  Medications Ordered in UC Medications - No data to display  Initial Impression / Assessment and Plan / UC Course  I have reviewed the triage vital signs and the nursing notes.  Pertinent labs & imaging results that were available during my care of the patient were reviewed by me and considered in my medical decision making (see chart for details).    Discussed the benefits vs risk of antiviral therapy. Opted against treatment as patient is actively nursing her premature infant and overall is in a general well state of health with well controlled asthma requiring no maintenance treatment.  Continue symptom management as discussed. Final Clinical Impressions(s) / UC Diagnoses   Final diagnoses:  Acute upper respiratory infection  Non-recurrent acute suppurative otitis media of left ear without spontaneous rupture of tympanic membrane   Discharge Instructions   None    ED Prescriptions     Medication Sig Dispense Auth. Provider   ofloxacin (FLOXIN) 0.3 % OTIC solution Place 3 drops into the left ear 2 (two) times daily for 7 days. 2.1 mL Bing Neighbors, FNP   Guaifenesin [facility] MAXIMUM STRENGTH) 1200 MG TB12 Take 1 tablet (1,200 mg total) by mouth every 12 (twelve) hours as needed. 14 tablet Bing Neighbors, FNP      PDMP not reviewed this encounter.   Bing Neighbors, FNP 08/02/22 1724

## 2022-08-02 NOTE — Discharge Instructions (Addendum)
As discussed your COVID test result will result this afternoon.  If positive recommend quarantine for total of 5 days and for the subsequent 5 days wearing a mask.   Recommend continue management of symptoms with medication prescribed. Paxlovid (antiviral for COVID) is contraindicated in breastfeeding.  Mucinex (plain) for cough.

## 2022-08-02 NOTE — ED Triage Notes (Signed)
Patient report testing for covid and it was negative  boyfriend is sick as well Patient is breast feeding

## 2022-08-02 NOTE — ED Triage Notes (Signed)
Drainage in throat and intermittent headache since Sunday night.  Patient reports an irritated throat and fever 100 max.

## 2022-09-02 ENCOUNTER — Telehealth: Payer: Self-pay | Admitting: Family Medicine

## 2022-09-02 NOTE — Telephone Encounter (Signed)
Calling about her Breast Pump Rx

## 2022-09-05 NOTE — Telephone Encounter (Signed)
Called and spoke w/pt regarding her concerns. She stated that [employer] had faxed Breast Pump order to be signed in August and again on 9/14. I advised that we will get it returned to [employer]. If it has not been received, we will let her know. Pt voiced understanding.

## 2022-09-12 ENCOUNTER — Other Ambulatory Visit: Payer: Self-pay | Admitting: *Deleted

## 2022-09-12 NOTE — Progress Notes (Signed)
Order for breast pump faxed to [employer].

## 2023-03-15 ENCOUNTER — Ambulatory Visit (INDEPENDENT_AMBULATORY_CARE_PROVIDER_SITE_OTHER): Payer: Self-pay | Admitting: Advanced Practice Midwife

## 2023-03-15 ENCOUNTER — Encounter: Payer: Self-pay | Admitting: Advanced Practice Midwife

## 2023-03-15 VITALS — BP 119/78 | HR 98 | Ht 65.0 in | Wt 295.0 lb

## 2023-03-15 DIAGNOSIS — Z01419 Encounter for gynecological examination (general) (routine) without abnormal findings: Secondary | ICD-10-CM | POA: Diagnosis not present

## 2023-03-15 DIAGNOSIS — Z0000 Encounter for general adult medical examination without abnormal findings: Secondary | ICD-10-CM

## 2023-03-15 NOTE — Patient Instructions (Signed)
Mediterranean Diet ?A Mediterranean diet refers to food and lifestyle choices that are based on the traditions of countries located on the [place] Sea. It focuses on eating more fruits, vegetables, whole grains, beans, nuts, seeds, and heart-healthy fats, and eating less dairy, meat, eggs, and processed foods with added sugar, salt, and fat. This way of eating has been shown to help prevent certain conditions and improve outcomes for people who have chronic diseases, like kidney disease and heart disease. ?What are tips for following this plan? ?Reading food labels ?Check the serving size of packaged foods. For foods such as rice and pasta, the serving size refers to the amount of cooked product, not dry. ?Check the total fat in packaged foods. Avoid foods that have saturated fat or trans fats. ?Check the ingredient list for added sugars, such as corn syrup. ?Shopping ? ?Buy a variety of foods that offer a balanced diet, including: ?Fresh fruits and vegetables (produce). ?Grains, beans, nuts, and seeds. Some of these may be available in unpackaged forms or large amounts (in bulk). ?Fresh seafood. ?Poultry and eggs. ?Low-fat dairy products. ?Buy whole ingredients instead of prepackaged foods. ?Buy fresh fruits and vegetables in-season from local farmers markets. ?Buy plain frozen fruits and vegetables. ?If you do not have access to quality fresh seafood, buy precooked frozen shrimp or canned fish, such as tuna, salmon, or sardines. ?Stock your pantry so you always have certain foods on hand, such as olive oil, canned tuna, canned tomatoes, rice, pasta, and beans. ?Cooking ?Cook foods with extra-virgin olive oil instead of using butter or other vegetable oils. ?Have meat as a side dish, and have vegetables or grains as your main dish. This means having meat in small portions or adding small amounts of meat to foods like pasta or stew. ?Use beans or vegetables instead of meat in common dishes like chili or  lasagna. ?Experiment with different cooking methods. Try roasting, broiling, steaming, and saut?ing vegetables. ?Add frozen vegetables to soups, stews, pasta, or rice. ?Add nuts or seeds for added healthy fats and plant protein at each meal. You can add these to yogurt, salads, or vegetable dishes. ?Marinate fish or vegetables using olive oil, lemon juice, garlic, and fresh herbs. ?Meal planning ?Plan to eat one vegetarian meal one day each week. Try to work up to two vegetarian meals, if possible. ?Eat seafood two or more times a week. ?Have healthy snacks readily available, such as: ?Vegetable sticks with hummus. ?Greek yogurt. ?Fruit and nut trail mix. ?Eat balanced meals throughout the week. This includes: ?Fruit: 2-3 servings a day. ?Vegetables: 4-5 servings a day. ?Low-fat dairy: 2 servings a day. ?Fish, poultry, or lean meat: 1 serving a day. ?Beans and legumes: 2 or more servings a week. ?Nuts and seeds: 1-2 servings a day. ?Whole grains: 6-8 servings a day. ?Extra-virgin olive oil: 3-4 servings a day. ?Limit red meat and sweets to only a few servings a month. ?Lifestyle ? ?Cook and eat meals together with your family, when possible. ?Drink enough fluid to keep your urine pale yellow. ?Be physically active every day. This includes: ?Aerobic exercise like running or swimming. ?Leisure activities like gardening, walking, or housework. ?Get 7-8 hours of sleep each night. ?If recommended by your health care provider, drink red wine in moderation. This means 1 glass a day for nonpregnant women and 2 glasses a day for men. A glass of wine equals 5 oz (150 mL). ?What foods should I eat? ?Fruits ?Apples. Apricots. Avocado. Berries. Bananas. Cherries. Dates.   Figs. Grapes. Lemons. Melon. Oranges. Peaches. Plums. Pomegranate. ?Vegetables ?Artichokes. Beets. Broccoli. Cabbage. Carrots. Eggplant. Green beans. Chard. Kale. Spinach. Onions. Leeks. Peas. Squash. Tomatoes. Peppers. Radishes. ?Grains ?Whole-grain pasta. Brown  rice. Bulgur wheat. Polenta. Couscous. Whole-wheat bread. Oatmeal. Quinoa. ?Meats and other proteins ?Beans. Almonds. Sunflower seeds. Pine nuts. Peanuts. Cod. Salmon. Scallops. Shrimp. Tuna. Tilapia. Clams. Oysters. Eggs. Poultry without skin. ?Dairy ?Low-fat milk. Cheese. Greek yogurt. ?Fats and oils ?Extra-virgin olive oil. Avocado oil. Grapeseed oil. ?Beverages ?Water. Red wine. Herbal tea. ?Sweets and desserts ?Greek yogurt with honey. Baked apples. Poached pears. Trail mix. ?Seasonings and condiments ?Basil. Cilantro. Coriander. Cumin. Mint. Parsley. Sage. Rosemary. Tarragon. Garlic. Oregano. Thyme. Pepper. Balsamic vinegar. Tahini. Hummus. Tomato sauce. Olives. Mushrooms. ?The items listed above may not be a complete list of foods and beverages you can eat. Contact a dietitian for more information. ?What foods should I limit? ?This is a list of foods that should be eaten rarely or only on special occasions. ?Fruits ?Fruit canned in syrup. ?Vegetables ?Deep-fried potatoes (french fries). ?Grains ?Prepackaged pasta or rice dishes. Prepackaged cereal with added sugar. Prepackaged snacks with added sugar. ?Meats and other proteins ?Beef. Pork. Lamb. Poultry with skin. Hot dogs. Bacon. ?Dairy ?Ice cream. Sour cream. Whole milk. ?Fats and oils ?Butter. Canola oil. Vegetable oil. Beef fat (tallow). Lard. ?Beverages ?Juice. Sugar-sweetened soft drinks. Beer. Liquor and spirits. ?Sweets and desserts ?Cookies. Cakes. Pies. Candy. ?Seasonings and condiments ?Mayonnaise. Pre-made sauces and marinades. ?The items listed above may not be a complete list of foods and beverages you should limit. Contact a dietitian for more information. ?Summary ?The Mediterranean diet includes both food and lifestyle choices. ?Eat a variety of fresh fruits and vegetables, beans, nuts, seeds, and whole grains. ?Limit the amount of red meat and sweets that you eat. ?If recommended by your health care provider, drink red wine in moderation.  This means 1 glass a day for nonpregnant women and 2 glasses a day for men. A glass of wine equals 5 oz (150 mL). ?This information is not intended to replace advice given to you by your health care provider. Make sure you discuss any questions you have with your health care provider. ?Document Revised: 01/10/2020 Document Reviewed: 11/07/2019 ?Elsevier Patient Education ? 2023 [employer] ? ?

## 2023-03-15 NOTE — Progress Notes (Signed)
[facility]   Gynecology Annual Exam   PCP: Patient, No Pcp Per  Chief Complaint:  Chief Complaint  Patient presents with   Annual Exam    History of Present Illness: Patient is a 27 y.o. [id number] presents for annual exam. The patient has no complaints today.   LMP: Patient's last menstrual period was 01/17/2023. Average Interval: irregular, every 1-2 months Duration of flow:  2-4 days   Heavy Menses: no Clots: no Intermenstrual Bleeding: no Postcoital Bleeding: no Dysmenorrhea: no  The patient is sexually active. She currently uses coitus interruptus for contraception. She denies dyspareunia.  The patient does perform self breast exams.  There is no notable family history of breast or ovarian cancer in her family. She is currently pumping breast milk for her premature baby.  The patient wears seatbelts: yes.   The patient has regular exercise:  she is active with her 29 year old .  She admits her diet is mostly fast food/carbs, she admits adequate hydration. She usually sleeps about 5 hours  The patient denies current symptoms of depression.    Review of Systems: Review of Systems  Constitutional:  Positive for malaise/fatigue. Negative for chills and fever.  HENT:  Positive for congestion. Negative for ear discharge, ear pain, hearing loss, sinus pain and sore throat.   Eyes:  Negative for blurred vision and double vision.  Respiratory:  Negative for cough, shortness of breath and wheezing.   Cardiovascular:  Negative for chest pain, palpitations and leg swelling.  Gastrointestinal:  Negative for abdominal pain, blood in stool, constipation, diarrhea, heartburn, melena, nausea and vomiting.  Genitourinary:  Positive for urgency. Negative for dysuria, flank pain, frequency and hematuria.  Musculoskeletal:  Positive for joint pain. Negative for back pain and myalgias.  Skin:  Negative for itching and rash.  Neurological:  Positive for dizziness and headaches. Negative for  tingling, tremors, sensory change, speech change, focal weakness, seizures, loss of consciousness and weakness.  Endo/Heme/Allergies:  Positive for environmental allergies. Does not bruise/bleed easily.  Psychiatric/Behavioral:  Negative for depression, hallucinations, memory loss, substance abuse and suicidal ideas. The patient is not nervous/anxious and does not have insomnia.     Past Medical History:  Patient Active Problem List   Diagnosis Date Noted   HELLP (hemolytic anemia/elev liver enzymes/low platelets in pregnancy), antepartum 03/22/2022   Acquired pes planus of both feet 02/22/2022    Formatting of this note might be different from the original. 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 Contraception: coitus interruptus Last Pap: 3 years ago Results were: no abnormalities   Obstetric History: [id number]  Family History:  Family History  Problem Relation Age of Onset   Diabetes Maternal Grandmother    Hypertension Maternal Grandmother     Social History:  Social History   Socioeconomic History   Marital status: Single    Spouse name: Not on file   Number of children: Not on file   Years of education: Not on file   Highest education level: Not on file  Occupational History  Occupation: Armed forces logistics/support/administrative officer  Tobacco Use   Smoking status: Never   Smokeless tobacco: Never  Vaping Use   Vaping Use: Never used  Substance and Sexual Activity   Alcohol use: No   Drug use: Not Currently   Sexual activity: Yes    Birth control/protection: None  Other Topics Concern   Not on  file  Social History Narrative   Not on file   Social Determinants of Health   Financial Resource Strain: Not on file  Food Insecurity: Not on file  Transportation Needs: Not on file  Physical Activity: Not on file  Stress: Not on file  Social Connections: Not on file  Intimate Partner Violence: Not on file    Allergies:  Allergies  Allergen Reactions   Doxycycline Shortness Of Breath    Unable to breathe, SOB   Zithromax [Azithromycin] Hives   Penicillins Rash    Medications: Prior to Admission medications   Medication Sig Start Date End Date Taking? Authorizing Provider  Guaifenesin (MUCINEX MAXIMUM STRENGTH) 1200 MG TB12 Take 1 tablet (1,200 mg total) by mouth every 12 (twelve) hours as needed. Patient not taking: Reported on 03/15/2023 08/02/22   Scot Jun, NP  ibuprofen (ADVIL) 600 MG tablet Take 1 tablet (600 mg total) by mouth every 6 (six) hours as needed for moderate pain. Patient not taking: Reported on 03/15/2023 03/27/22   Chancy Milroy, MD  NIFEdipine (ADALAT CC) 30 MG 24 hr tablet Take 1 tablet (30 mg total) by mouth daily. Patient not taking: Reported on 05/13/2022 03/27/22   Chancy Milroy, MD  Prenatal Vit-Fe Fumarate-FA (PRENATAL PO) Take by mouth. Patient not taking: Reported on 03/15/2023    [provider]    Physical Exam Vitals: Blood pressure 119/78, pulse 98, height 5\' 5"  (1.651 m), weight 295 lb (133.8 kg), last menstrual period 01/17/2023, currently breastfeeding.  General: NAD HEENT: normocephalic, anicteric Thyroid: no enlargement, no palpable nodules Pulmonary: No increased work of breathing, CTAB Cardiovascular: RRR, distal pulses 2+ Breast: Breast symmetrical, no tenderness, no palpable nodules or masses, no skin or nipple retraction present, no nipple discharge.  No axillary or supraclavicular lymphadenopathy. Abdomen: NABS, soft, non-tender, non-distended.  Umbilicus without lesions.  No hepatomegaly, splenomegaly or masses  palpable. No evidence of hernia  Genitourinary: deferred for PAP interval, no concerns Extremities: no edema, erythema, or tenderness Neurologic: Grossly intact Psychiatric: mood appropriate, affect full   Assessment: 27 y.o. [id number] routine annual exam  Plan: Problem List Items Addressed This Visit   None Visit Diagnoses     Well woman exam without gynecological exam    -  Primary       1) STI screening  was offered and declined  2)  ASCCP guidelines and rationale discussed.  Patient opts for every 5 years screening interval. Due in 2026  3) Contraception - the patient is currently using  coitus interruptus.  She is interested in changing to Nexplanon eventually.  4) Routine healthcare maintenance including cholesterol, diabetes screening discussed Declines  5) Increase healthy lifestyle; diet, exercise, sleep  6) Return in about 1 year (around 03/14/2024) for annual established gyn.   Rod Can, [facility] 03/15/2023, 3:54 PM

## 2023-08-25 IMAGING — US US OB < 14 WEEKS - US OB TV
1 series · 13 of 28 positions shown · non-contrast
Comparison: None.

CLINICAL DATA: Light vaginal bleeding with cramping for 1 day.
Estimated gestational age by LMP is 6 weeks 0 days. Positive urine
pregnancy test with quantitative beta HCG 104

EXAM:
OBSTETRIC <14 WK US AND TRANSVAGINAL OB US
TECHNIQUE: Both transabdominal and transvaginal ultrasound examinations were
performed for complete evaluation of the gestation as well as the
maternal uterus, adnexal regions, and pelvic cul-de-sac.
Transvaginal technique was performed to assess early pregnancy.

[Series 1: us ob less than 14 weeks with ob transvaginal · 13 of 94 slices shown]
[im 4/94]
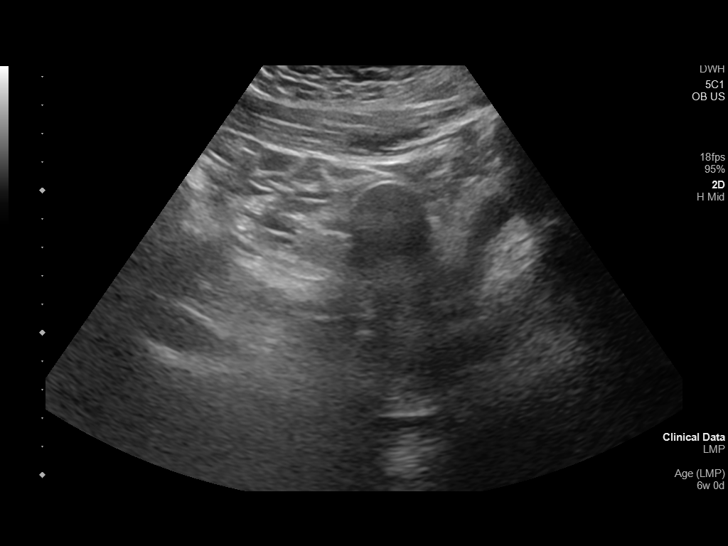
[im 11/94]
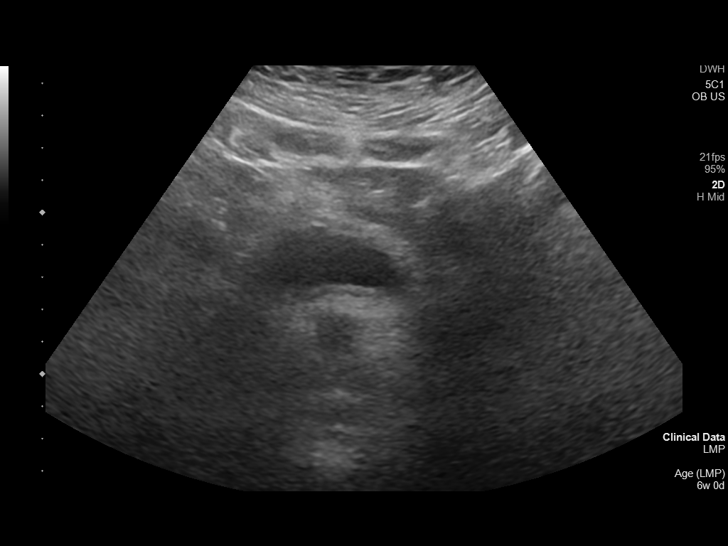
[im 18/94]
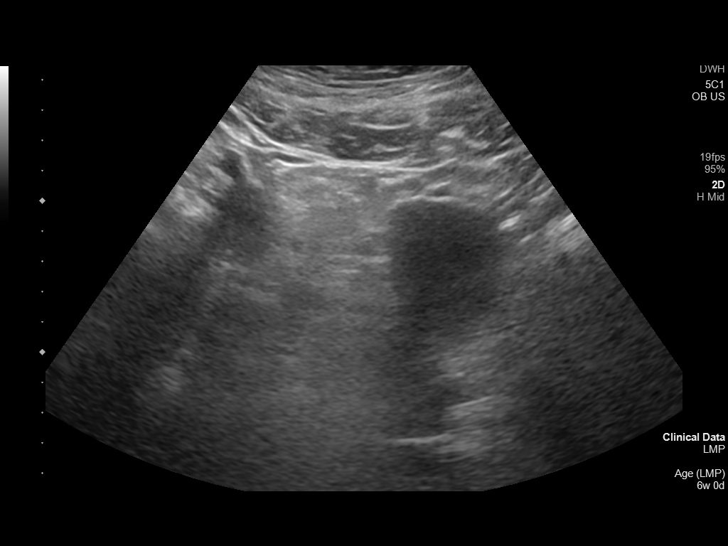
[im 25/94]
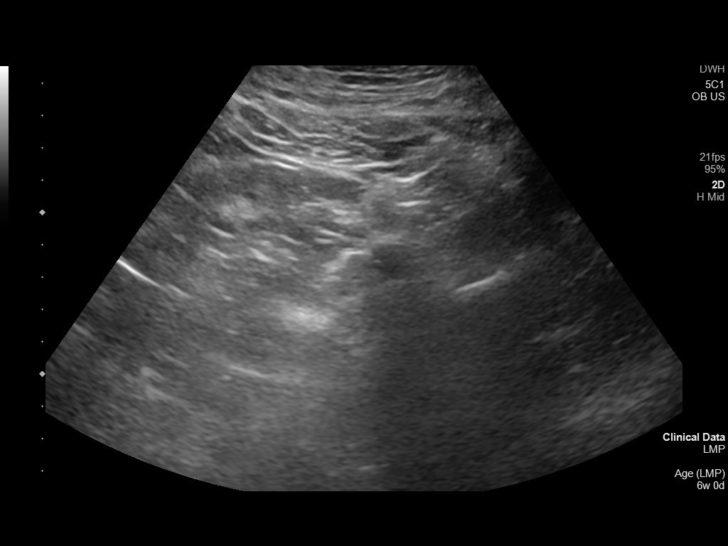
[im 32/94]
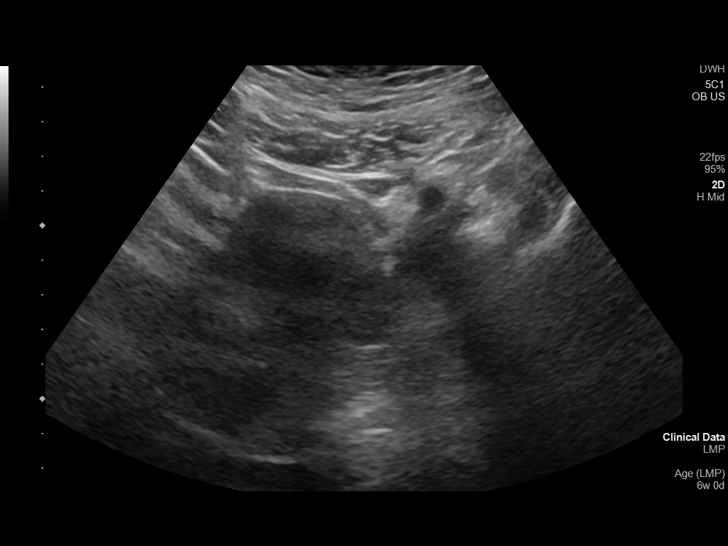
[im 38/94]
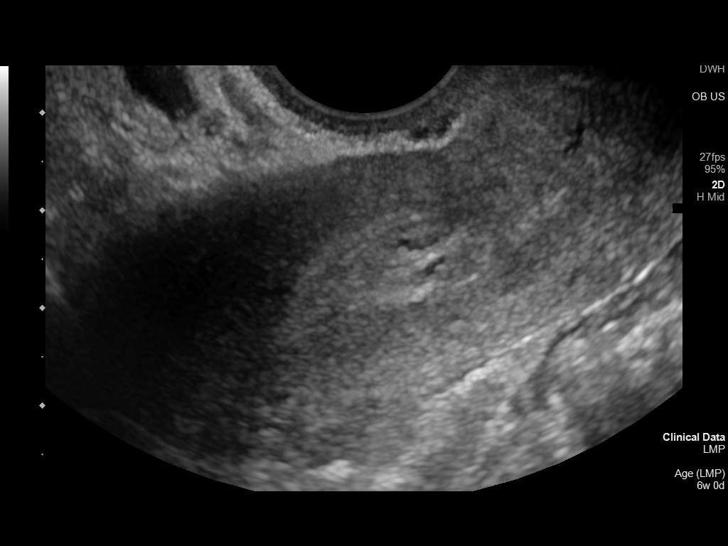
[im 49/94]
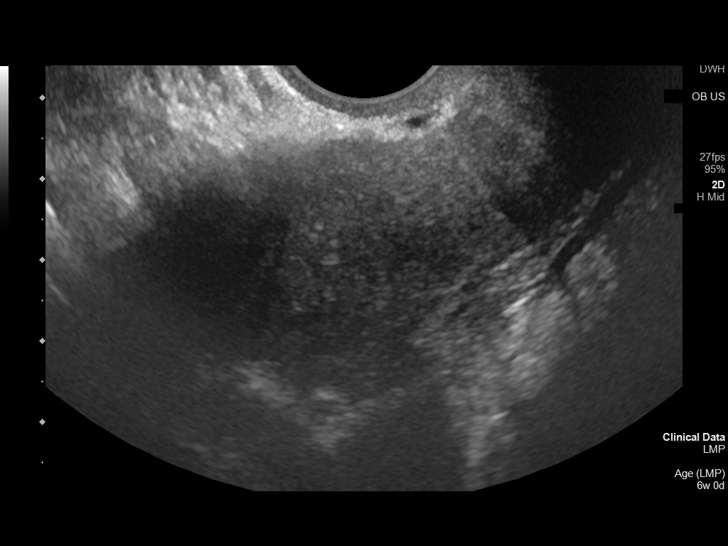
[im 56/94]
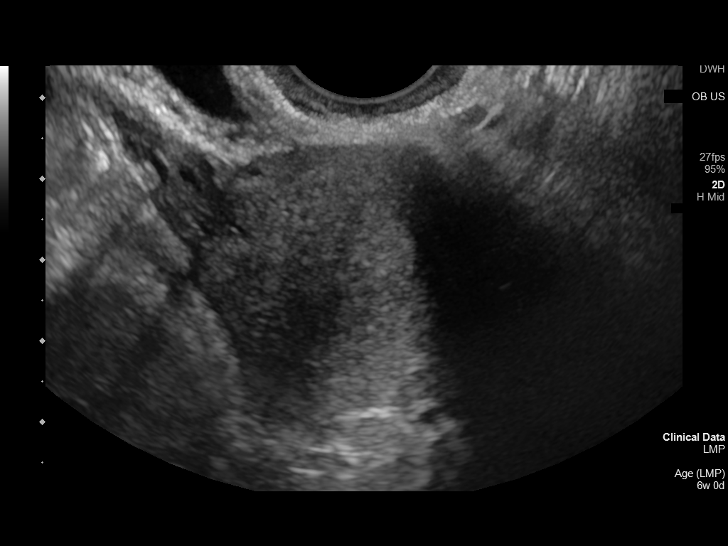
[im 63/94]
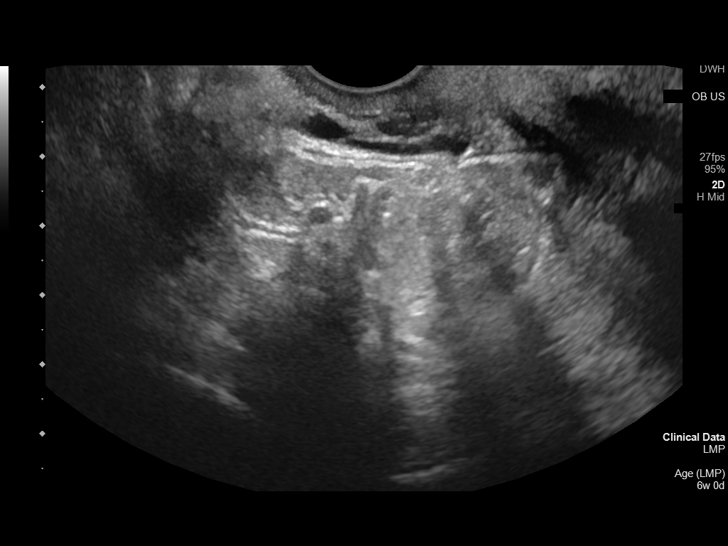
[im 69/94]
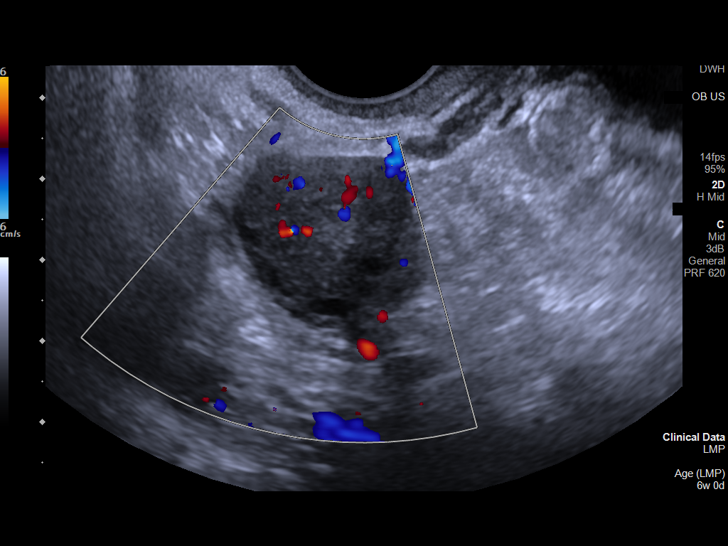
[im 76/94]
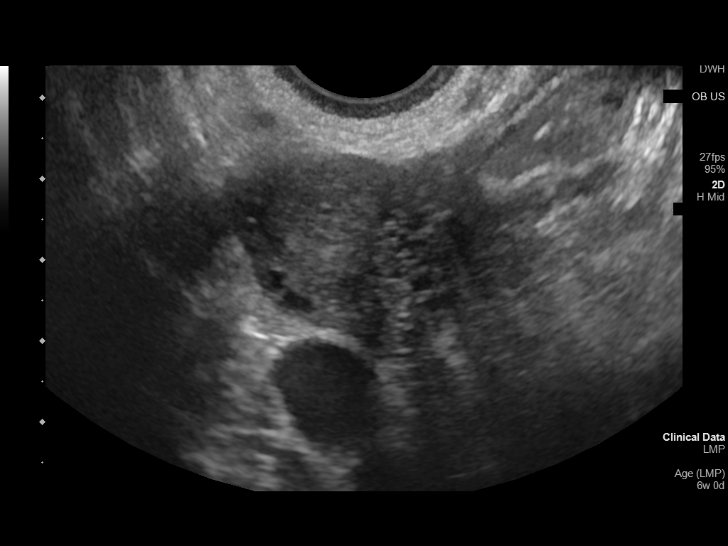
[im 83/94]
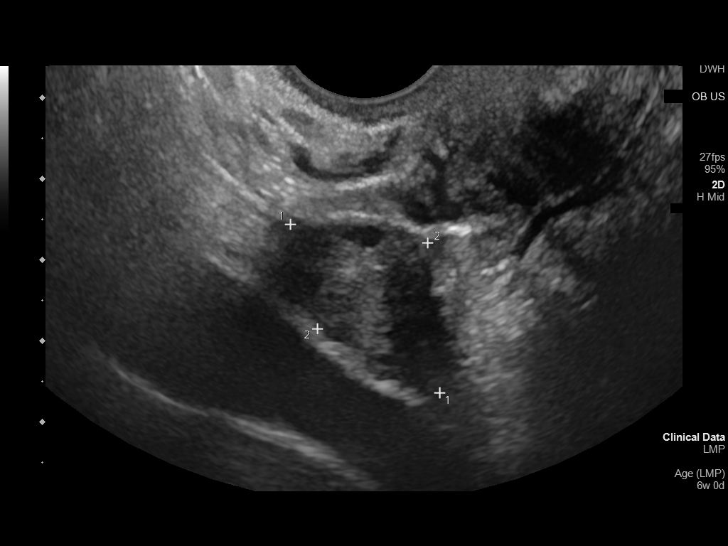
[im 90/94]
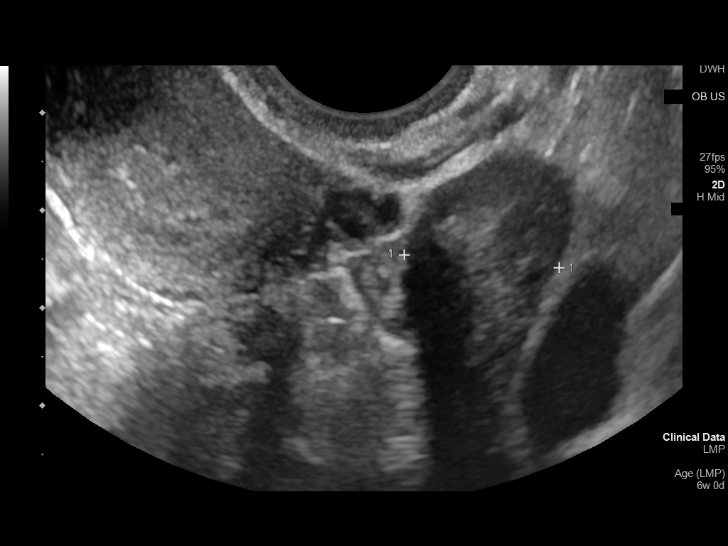

[13 of 28 positions shown; findings below may reference images not displayed]

FINDINGS: Intrauterine gestational sac: None

Yolk sac:  Not Visualized.

Embryo:  Not Visualized.

Cardiac Activity: Not Visualized.

Maternal uterus/adnexae: Uterus is anteverted. No myometrial mass
lesions are identified.

Endometrial stripe thickness measures 9 mm. Somewhat heterogeneous
appearance of the endometrium with several tiny hypoechoic foci
demonstrated. No defined gestational sac is present.

Both ovaries are visualized and appear normal. Normal follicular
changes. No abnormal adnexal masses or collections. Trace free fluid
in the pelvis.
IMPRESSION: Pregnancy of unknown location. No intrauterine gestational sac, yolk
sac, or fetal pole identified. Differential considerations include
intrauterine pregnancy too early to be sonographically visualized,
missed abortion, or ectopic pregnancy. Followup ultrasound is
recommended in 10-14 days for further evaluation.

## 2023-10-03 ENCOUNTER — Ambulatory Visit (INDEPENDENT_AMBULATORY_CARE_PROVIDER_SITE_OTHER): Payer: Self-pay | Admitting: Certified Nurse Midwife

## 2023-10-03 ENCOUNTER — Encounter: Payer: Self-pay | Admitting: Certified Nurse Midwife

## 2023-10-03 VITALS — BP 133/84 | HR 85 | Ht 64.0 in | Wt 310.2 lb

## 2023-10-03 DIAGNOSIS — Z30017 Encounter for initial prescription of implantable subdermal contraceptive: Secondary | ICD-10-CM

## 2023-10-03 DIAGNOSIS — Z3202 Encounter for pregnancy test, result negative: Secondary | ICD-10-CM

## 2023-10-03 LAB — POCT URINE PREGNANCY: Preg Test, Ur: NEGATIVE

## 2023-10-03 MED ORDER — ETONOGESTREL 68 MG ~~LOC~~ IMPL
68.0000 mg | DRUG_IMPLANT | Freq: Once | SUBCUTANEOUS | Status: AC
  Administered 2023-10-03: 68 mg via SUBCUTANEOUS

## 2023-10-03 NOTE — Patient Instructions (Signed)
Nexplanon Instructions After Insertion  Keep bandage clean and dry for 24 hours  May use ice/Tylenol/Ibuprofen for soreness or pain  If you develop fever, drainage or increased warmth from incision site-contact office immediately

## 2023-10-03 NOTE — Progress Notes (Signed)
Brianna Gilmore is a 27 y.o. year old African American female here for Nexplanon insertion.  Patient's last menstrual period was 09/24/2023 (exact date)., and her pregnancy test today was negative.  Risks/benefits/side effects of Nexplanon have been discussed and her questions have been answered.  Specifically, a failure rate of 12/998 has been reported, with an increased failure rate if pt takes St. John's Wort and/or antiseizure medicaitons.  Brianna Gilmore is aware of the common side effect of irregular bleeding, which the incidence of decreases over time.  BP 133/84   Pulse 85   Ht 5\' 4"  (1.626 m)   Wt (!) 310 lb 3.2 oz (140.7 kg)   LMP 09/24/2023 (Exact Date)   Breastfeeding No   BMI 53.25 kg/m   Results for orders placed or performed in visit on 10/03/23 (from the past 24 hour(s))  POCT urine pregnancy   Collection Time: 10/03/23  2:51 PM  Result Value Ref Range   Preg Test, Ur Negative Negative     She is right-handed, so her left arm, approximately 4 inches proximal from the elbow, was cleansed with alcohol and anesthetized with 2cc of 2% Lidocaine.  The area was cleansed again with betadine and the Nexplanon was inserted per manufacturer's recommendations without difficulty.  A steri-strip and pressure bandage were applied.  Pt was instructed to keep the area clean and dry, remove pressure bandage in 24 hours, and keep insertion site covered with the steri-strip for 3-5 days.  Back up contraception was recommended for 2 weeks.  She was given a card indicating date Nexplanon was inserted and date it needs to be removed. Follow-up PRN problems.  Pattricia Boss [name],CNM

## 2024-03-05 ENCOUNTER — Ambulatory Visit
Admission: RE | Admit: 2024-03-05 | Discharge: 2024-03-05 | Disposition: A | Discharge status: Home / Self Care | Source: Ambulatory Visit | Attending: Family Medicine

## 2024-03-05 ENCOUNTER — Ambulatory Visit
Admission: RE | Admit: 2024-03-05 | Discharge: 2024-03-05 | Disposition: A | Discharge status: Home / Self Care | Attending: Family Medicine | Admitting: Family Medicine

## 2024-03-05 ENCOUNTER — Encounter: Payer: Self-pay | Admitting: Family Medicine

## 2024-03-05 ENCOUNTER — Ambulatory Visit: Admitting: Family Medicine

## 2024-03-05 VITALS — BP 126/78 | HR 98 | Ht 64.0 in | Wt 294.3 lb

## 2024-03-05 DIAGNOSIS — R61 Generalized hyperhidrosis: Secondary | ICD-10-CM

## 2024-03-05 DIAGNOSIS — F17200 Nicotine dependence, unspecified, uncomplicated: Secondary | ICD-10-CM | POA: Diagnosis not present

## 2024-03-05 DIAGNOSIS — M25562 Pain in left knee: Secondary | ICD-10-CM

## 2024-03-05 DIAGNOSIS — Z7689 Persons encountering health services in other specified circumstances: Secondary | ICD-10-CM | POA: Diagnosis not present

## 2024-03-05 DIAGNOSIS — G44209 Tension-type headache, unspecified, not intractable: Secondary | ICD-10-CM

## 2024-03-05 DIAGNOSIS — Z3046 Encounter for surveillance of implantable subdermal contraceptive: Secondary | ICD-10-CM | POA: Diagnosis not present

## 2024-03-05 DIAGNOSIS — M79622 Pain in left upper arm: Secondary | ICD-10-CM

## 2024-03-05 DIAGNOSIS — G8929 Other chronic pain: Secondary | ICD-10-CM | POA: Diagnosis not present

## 2024-03-05 DIAGNOSIS — M25512 Pain in left shoulder: Secondary | ICD-10-CM | POA: Diagnosis not present

## 2024-03-05 NOTE — Progress Notes (Unsigned)
 New Patient Office Visit  Introduced to nurse practitioner role and practice setting.  All questions answered.  Discussed provider/patient relationship and expectations.   Subjective    Patient ID: Brianna Gilmore, female    DOB: 28-Dec-1995  Age: 28 y.o. MRN: [id number]  CC:  Chief Complaint  Patient presents with  . New Patient (Initial Visit)    HPI Brianna Gilmore presents to establish care History of Present Illness Brianna Gilmore is a 28 year old female who presents with left arm pain and night sweats.  She has been experiencing left arm pain for several months, radiating from the shoulder to the elbow. The pain is described as an ache and is tender to touch. It began around the time she had her Nexplanon replaced in October 2024, which was placed higher than usual. She has not yet consulted her OB GYN about this issue.  She experiences severe night sweats, waking up drenched as if 'someone threw water on me.' This occurs nightly, necessitating frequent changes of sheets. Sweating also occurs after showering and when exposed to heat, but not cold. No fever accompanies the sweating.  She is currently experiencing headaches, described as a dull ache, sometimes shooting behind the eyes, and feels like a 'rubber band around the head.' No aura or vision changes are associated with the headaches. She has a history of migraines.  She reports knee and ankle pain, particularly when standing for long periods or stepping incorrectly. She attributes this to weight gain post-pregnancy and notes that she tends to put more weight on her left side. She has a history of a sprained ankle but no known knee injuries.  She has a history of childhood asthma, which she manages with an inhaler as needed. She also has allergies to penicillin, Zithromax, doxycycline, pollen, and sun exposure, which causes hives. She manages the sun allergy by avoiding sun exposure.  She smokes black and white cigarettes  daily, about one or two a day. She is currently not working due to being laid off and is experiencing stress related to job instability.  Physical Exam MUSCULOSKELETAL: Tenderness on palpation of the knee.  Assessment and Plan Left arm pain Chronic left arm pain possibly due to Nexplanon migration. No neck or distal involvement. Previous Nexplanon insertions without issues. - Order x-ray of left upper arm to assess Nexplanon placement. - Advise follow-up with OB GYN regarding Nexplanon placement.  Night sweats Severe nightly night sweats without fever. Possible hormonal imbalance or thyroid dysfunction. - Order general lab workup including thyroid function tests and hormone levels.  Tension headaches Recurrent tension headaches likely stress-related. Inadequate hydration may contribute. - Advise use of over-the-counter analgesics such as Tylenol or ibuprofen. - Recommend maintaining hydration and keeping a headache journal. - Advise follow-up if headaches worsen or change in character.  Knee and ankle pain Intermittent pain likely due to previous ankle sprain and post-pregnancy weight gain. No known knee injury. - Order x-ray of knee to assess for structural issues. - Recommend conservative management with elevation, heat, and ice. - Advise strengthening exercises for supporting muscles around the knee. - Consider referral to orthopedics if pain persists.  General Health Maintenance Smokes 1-2 cigarettes daily, poor hydration habits. - Advise increasing water intake to 2-3 liters per day. - Discuss smoking cessation options.  Left arm  6 months - ache, sore to touch, upper arm. Xray ??  Sweating - heat in tolerance nightly occurrence  Headaches - ache no throbbing  Spotting once month  Outpatient Encounter Medications as of 03/05/2024  Medication Sig  . [DISCONTINUED] Guaifenesin (MUCINEX MAXIMUM STRENGTH) 1200 MG TB12 Take 1 tablet (1,200 mg total) by mouth every 12  (twelve) hours as needed. (Patient not taking: Reported on 03/05/2024)  . [DISCONTINUED] ibuprofen (ADVIL) 600 MG tablet Take 1 tablet (600 mg total) by mouth every 6 (six) hours as needed for moderate pain. (Patient not taking: Reported on 03/05/2024)  . [DISCONTINUED] NIFEdipine (ADALAT CC) 30 MG 24 hr tablet Take 1 tablet (30 mg total) by mouth daily. (Patient not taking: Reported on 03/05/2024)  . [DISCONTINUED] Prenatal Vit-Fe Fumarate-FA (PRENATAL PO) Take by mouth. (Patient not taking: Reported on 03/05/2024)   No facility-administered encounter medications on file as of 03/05/2024.    Past Medical History:  Diagnosis Date  . Allergy   . Asthma    Childhood  . Headache    ~28 years old when pt stopped having regular migraines  . HELLP syndrome   . Hypertension     Past Surgical History:  Procedure Laterality Date  . CESAREAN SECTION N/A 03/24/2022   Procedure: CESAREAN SECTION;  Surgeon: Hermina Staggers, MD;  Location: [facility] OR[facility];  Service: Obstetrics;  Laterality: N/A;  . WISDOM TOOTH EXTRACTION      Family History  Problem Relation Age of Onset  . Hypothyroidism Daughter   . Diabetes Maternal Grandmother   . Hypertension Maternal Grandmother     Social History   Socioeconomic History  . Marital status: Single    Spouse name: Not on file  . Number of children: Not on file  . Years of education: Not on file  . Highest education level: Not on file  Occupational History  . Occupation: Water engineer  Tobacco Use  . Smoking status: Never  . Smokeless tobacco: Never  Vaping Use  . Vaping status: Never Used  Substance and Sexual Activity  . Alcohol use: No  . Drug use: Not Currently  . Sexual activity: Yes    Birth control/protection: None  Other Topics Concern  . Not on file  Social History Narrative  . Not on file   Social Drivers of Health   Financial Resource Strain: Not on file  Food Insecurity: Not on file  Transportation Needs: Not on file  Physical  Activity: Not on file  Stress: Not on file  Social Connections: Not on file  Intimate Partner Violence: Not on file      03/05/2024    3:53 PM  GAD 7 : Generalized Anxiety Score  Nervous, Anxious, on Edge 1  Control/stop worrying 0  Worry too much - different things 0  Trouble relaxing 0  Restless 0  Easily annoyed or irritable 1  Afraid - awful might happen 0  Total GAD 7 Score 2  Anxiety Difficulty Not difficult at all   Flowsheet Row Office Visit from 03/05/2024 in [facility]  PHQ-9 Total Score 4       ROS     Objective    BP 126/78   Pulse 98   Ht 5\' 4"  (1.626 m)   Wt 294 lb 4.8 oz (133.5 kg)   SpO2 100%   BMI 50.52 kg/m   Physical Exam  {Labs (Optional):23779}    Assessment & Plan:   Excessive sweating -     CBC -     Comprehensive metabolic panel -     TSH -     Lipid panel -     Hemoglobin A1c -  C-reactive protein -     VITAMIN D 25 Hydroxy (Vit-D Deficiency, Fractures)  Chronic left shoulder pain -     DG Knee Complete 4 Views Left; Future  Chronic pain of left knee -     DG Shoulder Left; Future  Left upper arm pain -     DG Humerus Left; Future    Return in about 6 months (around 09/05/2024) for annual physical.   Sallee Provencal, FNP

## 2024-03-06 ENCOUNTER — Encounter: Payer: Self-pay | Admitting: Family Medicine

## 2024-03-06 ENCOUNTER — Other Ambulatory Visit: Payer: Self-pay | Admitting: Family Medicine

## 2024-03-06 DIAGNOSIS — Z7689 Persons encountering health services in other specified circumstances: Secondary | ICD-10-CM | POA: Insufficient documentation

## 2024-03-06 DIAGNOSIS — Z3046 Encounter for surveillance of implantable subdermal contraceptive: Secondary | ICD-10-CM | POA: Insufficient documentation

## 2024-03-06 DIAGNOSIS — M79622 Pain in left upper arm: Secondary | ICD-10-CM | POA: Insufficient documentation

## 2024-03-06 DIAGNOSIS — G8929 Other chronic pain: Secondary | ICD-10-CM | POA: Insufficient documentation

## 2024-03-06 DIAGNOSIS — G44209 Tension-type headache, unspecified, not intractable: Secondary | ICD-10-CM | POA: Insufficient documentation

## 2024-03-06 DIAGNOSIS — R61 Generalized hyperhidrosis: Secondary | ICD-10-CM | POA: Insufficient documentation

## 2024-03-06 DIAGNOSIS — E559 Vitamin D deficiency, unspecified: Secondary | ICD-10-CM

## 2024-03-06 DIAGNOSIS — F17200 Nicotine dependence, unspecified, uncomplicated: Secondary | ICD-10-CM | POA: Insufficient documentation

## 2024-03-06 LAB — COMPREHENSIVE METABOLIC PANEL
ALT: 16 IU/L (ref 0–32)
AST: 20 IU/L (ref 0–40)
Albumin: 4.2 g/dL (ref 4.0–5.0)
Alkaline Phosphatase: 121 IU/L (ref 44–121)
BUN/Creatinine Ratio: 13 (ref 9–23)
BUN: 12 mg/dL (ref 6–20)
Bilirubin Total: 0.3 mg/dL (ref 0.0–1.2)
CO2: 22 mmol/L (ref 20–29)
Calcium: 9.3 mg/dL (ref 8.7–10.2)
Chloride: 105 mmol/L (ref 96–106)
Creatinine, Ser: 0.9 mg/dL (ref 0.57–1.00)
Globulin, Total: 3.4 g/dL (ref 1.5–4.5)
Glucose: 79 mg/dL (ref 70–99)
Potassium: 4.6 mmol/L (ref 3.5–5.2)
Sodium: 141 mmol/L (ref 134–144)
Total Protein: 7.6 g/dL (ref 6.0–8.5)
eGFR: 89 mL/min/{1.73_m2} (ref >59)

## 2024-03-06 LAB — LIPID PANEL
Chol/HDL Ratio: 3.5 ratio (ref 0.0–4.4)
Cholesterol, Total: 108 mg/dL (ref 100–199)
HDL: 31 mg/dL — ABNORMAL LOW (ref >39)
LDL Chol Calc (NIH): 62 mg/dL (ref 0–99)
Triglycerides: 70 mg/dL (ref 0–149)
VLDL Cholesterol Cal: 15 mg/dL (ref 5–40)

## 2024-03-06 LAB — CBC
Hematocrit: 44 % (ref 34.0–46.6)
Hemoglobin: 14.3 g/dL (ref 11.1–15.9)
MCH: 27.7 pg (ref 26.6–33.0)
MCHC: 32.5 g/dL (ref 31.5–35.7)
MCV: 85 fL (ref 79–97)
Platelets: 356 10*3/uL (ref 150–450)
RBC: 5.16 x10E6/uL (ref 3.77–5.28)
RDW: 13.1 % (ref 11.7–15.4)
WBC: 8.3 10*3/uL (ref 3.4–10.8)

## 2024-03-06 LAB — TSH: TSH: 2.56 u[IU]/mL (ref 0.450–4.500)

## 2024-03-06 LAB — HEMOGLOBIN A1C
Est. average glucose Bld gHb Est-mCnc: 123 mg/dL
Hgb A1c MFr Bld: 5.9 % — ABNORMAL HIGH (ref 4.8–5.6)

## 2024-03-06 LAB — VITAMIN D 25 HYDROXY (VIT D DEFICIENCY, FRACTURES): Vit D, 25-Hydroxy: 8.8 ng/mL — ABNORMAL LOW (ref 30.0–100.0)

## 2024-03-06 LAB — C-REACTIVE PROTEIN: CRP: 9 mg/L (ref 0–10)

## 2024-03-06 MED ORDER — VITAMIN D (ERGOCALCIFEROL) 1.25 MG (50000 UNIT) PO CAPS: 50000.0000 [IU] | ORAL_CAPSULE | ORAL | 4 refills | Status: DC

## 2024-03-06 NOTE — Assessment & Plan Note (Addendum)
 Intermittent pain likely due to favoring left sideand post-pregnancy weight gain. No known knee injury. Tenderness to inferior region of patella bone, and mild tenderness to lateral and medical region of patella. No pain with ROM, full strength. No skin breakdown, pulses palpable, no change in sensation, no edema.  - Order x-ray of knee to assess for structural issues. - Recommend conservative management with elevation, heat, and ice. - Advise strengthening exercises for supporting muscles around the knee. - Consider referral to orthopedics if pain persists.

## 2024-03-06 NOTE — Assessment & Plan Note (Signed)
 Daily use of 1-2 black and milds Pt aware of risks Willing to decrease

## 2024-03-06 NOTE — Assessment & Plan Note (Signed)
 5 months of left arm pain since nexplanon placement in October 2024 Possibly due to Nexplanon migration.  Denies neck pain or pain below the elbow, only in shoulder and humerus region with ROM. Previous Nexplanon insertions without issues on R arm. - Order x-ray of left upper arm/shoulder to assess Nexplanon placement. - Advise follow-up with OB GYN regarding Nexplanon placement.

## 2024-03-06 NOTE — Assessment & Plan Note (Signed)
 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 - Advise use of over-the-counter analgesics such as Tylenol or ibuprofen. - Recommend maintaining hydration and keeping a headache journal. - Advise follow-up if headaches worsen or change in character.

## 2024-03-06 NOTE — Assessment & Plan Note (Signed)
 Palpated Nexplanon LUE, appears in place Concern for arm irritation due to pain shoulder to elbow since placement Pt concern may had migrated Xray ordered Instructed pt to communicate this to OB/Gyn as well Otherwise she is happy with nexplanon Gets one day of spotting per month

## 2024-03-06 NOTE — Assessment & Plan Note (Signed)
 Nightly night sweats Denies weight loss, fever, recent infection, cough Will check labs - TSH, CBC, CRP, A1C .

## 2024-03-06 NOTE — Assessment & Plan Note (Addendum)
 Associated with Nexplanon placement since October 2024 Will check Shoulder xray Recommend reaching out to OB/Gyn regarding nexplanon insertion site Recommend heat, ice, rest, rotate arms when holding child May use Tylenol and Ibuprofen

## 2024-03-06 NOTE — Assessment & Plan Note (Signed)
 Welcome tom [facility] Routine labs Xray on Shoulder and humerus due to LUE pain Xray to L knee due to chronic pain Recommend well balanced diet Increase daily exercise Caregiver strain with medical child - but had great support Denies need for behavioral health at this time  F/u 6 months

## 2024-03-08 ENCOUNTER — Ambulatory Visit: Payer: Medicaid Other | Admitting: Family Medicine

## 2024-09-05 ENCOUNTER — Encounter: Payer: Self-pay | Admitting: Family Medicine

## 2024-09-05 ENCOUNTER — Ambulatory Visit: Admitting: Family Medicine

## 2024-09-05 VITALS — BP 137/86 | HR 77 | Temp 99.2°F | Ht 64.0 in | Wt 289.7 lb

## 2024-09-05 DIAGNOSIS — R030 Elevated blood-pressure reading, without diagnosis of hypertension: Secondary | ICD-10-CM

## 2024-09-05 DIAGNOSIS — F17200 Nicotine dependence, unspecified, uncomplicated: Secondary | ICD-10-CM

## 2024-09-05 DIAGNOSIS — G8929 Other chronic pain: Secondary | ICD-10-CM

## 2024-09-05 DIAGNOSIS — S161XXA Strain of muscle, fascia and tendon at neck level, initial encounter: Secondary | ICD-10-CM | POA: Diagnosis not present

## 2024-09-05 DIAGNOSIS — M25572 Pain in left ankle and joints of left foot: Secondary | ICD-10-CM | POA: Diagnosis not present

## 2024-09-05 DIAGNOSIS — R202 Paresthesia of skin: Secondary | ICD-10-CM | POA: Diagnosis not present

## 2024-09-05 DIAGNOSIS — E559 Vitamin D deficiency, unspecified: Secondary | ICD-10-CM | POA: Diagnosis not present

## 2024-09-05 DIAGNOSIS — R200 Anesthesia of skin: Secondary | ICD-10-CM

## 2024-09-05 DIAGNOSIS — Z0001 Encounter for general adult medical examination with abnormal findings: Secondary | ICD-10-CM

## 2024-09-05 DIAGNOSIS — Z789 Other specified health status: Secondary | ICD-10-CM | POA: Insufficient documentation

## 2024-09-05 DIAGNOSIS — Z0000 Encounter for general adult medical examination without abnormal findings: Secondary | ICD-10-CM

## 2024-09-05 MED ORDER — VITAMIN D (ERGOCALCIFEROL) 1.25 MG (50000 UNIT) PO CAPS: 50000.0000 [IU] | ORAL_CAPSULE | ORAL | 4 refills | Status: AC

## 2024-09-05 MED ORDER — GABAPENTIN 100 MG PO CAPS: 100.0000 mg | ORAL_CAPSULE | Freq: Three times a day (TID) | ORAL | 3 refills | Status: AC

## 2024-09-05 MED ORDER — PREDNISONE 10 MG PO TABS: ORAL_TABLET | ORAL | 0 refills | Status: DC

## 2024-09-05 NOTE — Progress Notes (Signed)
 Complete physical exam  Patient: Brianna Gilmore   DOB: 12-07-1996   28 y.o. Female  MRN: [id number]  Introduced to nurse practitioner role and practice setting.  All questions answered.  Discussed provider/patient relationship and expectations.  Subjective:    Chief Complaint  Patient presents with   Annual Exam    Diet- General Exercise- No Overall feeling- okay Sleep- Fair Concerns- Left ankle pain, numbness in right arm    Brianna Gilmore is a 28 y.o. female who presents today for a complete physical exam. She reports consuming a general diet. The patient does not participate in regular exercise at present. She generally feels fairly well. She reports sleeping fairly well. She does have additional problems to discuss today.   Discussed the use of AI scribe software for clinical note transcription with the patient, who gave verbal consent to proceed.  History of Present Illness Brianna Gilmore is a 28 year old female who presents for annual physical, but has concerns for Left ankle pain and R arm numbness.  She experiences sporadic ankle pain primarily after activities involving prolonged standing or walking, such as walking around the mall or attending events like weddings. The pain is localized to the ankle and worsens when she places all her weight on one side. Relief is achieved by sitting and elevating the foot. No swelling or instability is noted, and she finds it more comfortable to move rather than stand still. She has not used over-the-counter pain medications like Tylenol  or ibuprofen  for relief.  She also experiences numbness and a 'numbing ache' in her arm, present for at least three to four months. The numbness starts from the shoulder and is described as a 'vibrating feeling' similar to when a limb falls asleep. There is no weakness in the hands, and she can hold objects without difficulty. The numbness occurs randomly, including during sleep, and does not worsen with  specific movements.  She smokes black and milds, approximately two to three per day, and acknowledges not drinking enough fluids daily. She is currently using Nexplanon  for birth control, which was placed about a year ago.    Most recent fall risk assessment:    09/05/2024   11:30 AM  Fall Risk   Falls in the past year? 0  Number falls in past yr: 0  Injury with Fall? 0  Risk for fall due to : No Fall Risks     Most recent depression screenings:    09/05/2024   11:30 AM 03/05/2024    3:51 PM  PHQ 2/9 Scores  PHQ - 2 Score 2 1  PHQ- 9 Score 6 4    Vision:Not within last year  and Dental: No current dental problems and No regular dental care   Patient Active Problem List   Diagnosis Date Noted   Vitamin D  deficiency 09/05/2024   Morbid obesity (HCC) 09/05/2024   Uses contraceptive implant for birth control 09/05/2024   Excessive sweating 03/06/2024   Chronic left shoulder pain 03/06/2024   Chronic pain of left knee 03/06/2024   Left upper arm pain 03/06/2024   Tension headache 03/06/2024   Encounter for surveillance of Nexplanon  subdermal contraceptive 03/06/2024   Encounter to establish care with new doctor 03/06/2024   Tobacco dependence with current use 03/06/2024   HELLP (hemolytic anemia/elev liver enzymes/low platelets in pregnancy), antepartum 03/22/2022   Acquired pes planus of both feet 02/22/2022   Genu valgus, congenital 02/22/2022   Asthma 06/12/2013   Past Medical History:  Diagnosis  Date   Allergy    Asthma    Childhood   Headache    ~28 years old when pt stopped having regular migraines   HELLP syndrome    Hypertension    Past Surgical History:  Procedure Laterality Date   CESAREAN SECTION N/A 03/24/2022   Procedure: CESAREAN SECTION;  Surgeon: Lorence Ozell CROME, MD;  Location: [facility];  Service: Obstetrics;  Laterality: N/A;   WISDOM TOOTH EXTRACTION     Social History   Tobacco Use   Smoking status: Every Day    Types: Cigars   Smokeless  tobacco: Never  Vaping Use   Vaping status: Never Used  Substance Use Topics   Alcohol use: No   Drug use: Not Currently   Allergies  Allergen Reactions   Doxycycline Shortness Of Breath    Unable to breathe, SOB   Zithromax [Azithromycin] Hives   Penicillins Rash      Patient Care Team: Wellington Curtis LABOR, FNP as PCP - General (Family Medicine)   Outpatient Medications Prior to Visit  Medication Sig   [DISCONTINUED] Vitamin D , Ergocalciferol , (DRISDOL ) 1.25 MG (50000 UNIT) CAPS capsule Take 1 capsule (50,000 Units total) by mouth every 7 (seven) days. (Patient not taking: Reported on 09/05/2024)   No facility-administered medications prior to visit.    ROS        Objective:     BP 137/86 (BP Location: Right Arm, Patient Position: Sitting, Cuff Size: Large)   Pulse 77   Temp 99.2 F (37.3 C) (Oral)   Ht 5' 4 (1.626 m)   Wt 289 lb 11.2 oz (131.4 kg)   SpO2 100%   BMI 49.73 kg/m  BP Readings from Last 3 Encounters:  09/05/24 137/86  03/05/24 126/78  10/03/23 133/84   Wt Readings from Last 3 Encounters:  09/05/24 289 lb 11.2 oz (131.4 kg)  03/05/24 294 lb 4.8 oz (133.5 kg)  10/03/23 (!) 310 lb 3.2 oz (140.7 kg)      Physical Exam Constitutional:      General: She is not in acute distress.    Appearance: Normal appearance. She is normal weight. She is not ill-appearing.  HENT:     Head: Normocephalic.     Right Ear: Tympanic membrane normal.     Left Ear: Tympanic membrane normal.     Nose: Nose normal.     Mouth/Throat:     Mouth: Mucous membranes are moist.     Pharynx: Oropharynx is clear. No oropharyngeal exudate or posterior oropharyngeal erythema.  Eyes:     General: Lids are normal.        Right eye: No discharge.        Left eye: No discharge.     Extraocular Movements: Extraocular movements intact.     Right eye: Normal extraocular motion.     Left eye: Normal extraocular motion.     Conjunctiva/sclera: Conjunctivae normal.     Right  eye: Right conjunctiva is not injected.     Left eye: Left conjunctiva is not injected.     Pupils: Pupils are equal, round, and reactive to light.  Neck:     Thyroid: No thyroid mass, thyromegaly or thyroid tenderness.     Vascular: No carotid bruit.  Cardiovascular:     Rate and Rhythm: Normal rate.     Pulses: Normal pulses.          Radial pulses are 2+ on the right side and 2+ on the left side.  Posterior tibial pulses are 2+ on the right side and 2+ on the left side.     Heart sounds: Normal heart sounds, S1 normal and S2 normal. No murmur heard.    No friction rub. No gallop.  Pulmonary:     Effort: Pulmonary effort is normal. No respiratory distress.     Breath sounds: Normal breath sounds. No stridor. No wheezing, rhonchi or rales.  Chest:     Comments: No breast concerns, not assessed Abdominal:     General: Bowel sounds are normal. There is no distension.     Palpations: Abdomen is soft. There is no mass.     Tenderness: There is no abdominal tenderness. There is no right CVA tenderness, left CVA tenderness, guarding or rebound.     Hernia: No hernia is present.  Genitourinary:    Comments: Not assessed, wants to hold on PAP Musculoskeletal:        General: No swelling or tenderness. Normal range of motion.     Cervical back: Normal range of motion. No rigidity.     Comments: L upper arm palpated, present implanted nexplanon   Lymphadenopathy:     Cervical: No cervical adenopathy.     Right cervical: No superficial, deep or posterior cervical adenopathy.    Left cervical: No superficial, deep or posterior cervical adenopathy.  Skin:    General: Skin is warm and dry.     Capillary Refill: Capillary refill takes less than 2 seconds.     Findings: No bruising or erythema.  Neurological:     General: No focal deficit present.     Mental Status: She is alert and oriented to person, place, and time. Mental status is at baseline.     GCS: GCS eye subscore is 4. GCS  verbal subscore is 5. GCS motor subscore is 6.     Cranial Nerves: No cranial nerve deficit.     Sensory: No sensory deficit.     Motor: No weakness, tremor or pronator drift.     Coordination: Romberg sign negative.     Gait: Gait is intact. Gait normal.  Psychiatric:        Attention and Perception: Attention and perception normal.        Mood and Affect: Mood and affect normal.        Speech: Speech normal.        Behavior: Behavior normal. Behavior is cooperative.        Thought Content: Thought content normal.        Cognition and Memory: Cognition and memory normal.        Judgment: Judgment normal.      No results found for any visits on 09/05/24.     Assessment & Plan:    Routine Health Maintenance and Physical Exam  Assessment and Plan Assessment & Plan  Annual Physical Things to do to keep yourself healthy  - Exercise at least 30-45 minutes a day, 3-4 days a week.  - Eat a low-fat diet with lots of fruits and vegetables, up to 7-9 servings per day.  - Seatbelts can save your life. Wear them always.  - Smoke detectors on every level of your home, check batteries every year.  - Eye Doctor - have an eye exam every 1-2 years  - Safe sex - if you may be exposed to STDs, use a condom.  - Alcohol -  If you drink, do it moderately, less than 2 drinks per day.  - Health Care Power  of Attorney. Choose someone to speak for you if you are not able.  - Depression is common in our stressful world.If you're feeling down or losing interest in things you normally enjoy, please come in for a visit.  - Violence - If anyone is threatening or hurting you, please call immediately.  Uses contraceptive implant for birth control - Has Nexpanon for birth control  BMI = 49.73, considered Morbid Obesity Continue to make conscious decisions for well balanced diet smaller portions with increase protein, fruits, veggies, water  as drink of choice, decrease starches, processed foods, and  saturated fats. Increase weekly exercise - 150 minutes per week as tolerated once ankle pain resolves or as tolerated.   Chronic left ankle pain Intermittent left ankle pain exacerbated by activity, relieved by rest and elevation. No swelling or instability. Possible differential includes osteoarthritis or other inflammatory conditions. No acute injury reported. - Recommend extra strength Tylenol  twice daily for inflammation. - Six day prednisone  taper for inflammation - Advise heat, ice, and elevation for symptomatic relief. - Recommend visiting [facility] for proper fitting shoes to assess gait and foot pressure. - Refer to podiatry for further evaluation and management. - If symptoms persist, consider further imaging such as an X-ray and potential referral to orthopedics or physical therapy.  Chronic right upper extremity numbness and pain Chronic numbness and aching pain in the left upper extremity, radiating from the shoulder. Present for approximately four months. No weakness, but sensation of arm falling asleep. Possible nerve impingement or neuropathic pain considered. - Recommend gabapentin  for neuropathic pain, starting with nighttime dosing due to potential drowsiness. - Advise shoulder exercises to alleviate potential nerve impingement. - Suggest visiting [facility] for imaging and further evaluation.  Elevated blood pressure w/o diag of HTN Blood pressure slightly elevated compared to previous visit. No acute stress reported. - Advise lifestyle modifications including reducing sodium intake, increasing daily exercise, increasing water  intake, and limiting caffeine.  Vitamin D  deficiency Vitamin D  deficiency noted, important for bone health. Previous issues with obtaining medication. - Send vitamin D  weekly supplement  - Recommend rechecking vitamin D  levels in three months.  Tobacco use (cigar) Cigar use reported at two to three cigars per day. - Advise reducing cigar use  to one to two cigars per day.   Health Maintenance  Topic Date Due   Pneumococcal Vaccine (1 of 2 - PCV) Never done   Hepatitis B Vaccine (1 of 3 - 19+ 3-dose series) Never done   Pap Smear  08/08/2023   COVID-19 Vaccine (3 - 2025-26 season) 08/19/2024   Flu Shot  03/18/2025*   DTaP/Tdap/Td vaccine (3 - Td or Tdap) 03/21/2032   HPV Vaccine  Completed   Hepatitis C Screening  Completed   HIV Screening  Completed   Meningitis B Vaccine  Aged Out  *Topic was postponed. The date shown is not the original due date.    Discussed health benefits of physical activity, and encouraged her to engage in regular exercise appropriate for her age and condition.  Annual physical exam  Chronic pain of left ankle -     Ambulatory referral to Podiatry -     predniSONE ; Take 60mg  PO daily x1d, then 50mg  daily x1d, then 40mg  daily x1d, then 30mg  daily x1d, then 20mg  daily x1d, then 10mg  daily x1d, then stop  Dispense: 21 tablet; Refill: 0  Numbness and tingling of right arm -     Gabapentin ; Take 1 capsule (100 mg total) by mouth 3 (  three) times daily. May start once nightly due to sedation effects  Dispense: 90 capsule; Refill: 3  Vitamin D  deficiency -     Vitamin D  (Ergocalciferol ); Take 1 capsule (50,000 Units total) by mouth every 7 (seven) days.  Dispense: 5 capsule; Refill: 4  Elevated blood-pressure reading without diagnosis of hypertension  Tobacco dependence with current use  Uses contraceptive implant for birth control  Morbid obesity (HCC)     Return in about 2 months (around 11/05/2024) for BP check and labs.     Curtis DELENA Boom, FNP

## 2024-09-12 ENCOUNTER — Ambulatory Visit (INDEPENDENT_AMBULATORY_CARE_PROVIDER_SITE_OTHER)

## 2024-09-12 ENCOUNTER — Ambulatory Visit: Payer: Self-pay

## 2024-09-12 DIAGNOSIS — M25572 Pain in left ankle and joints of left foot: Secondary | ICD-10-CM | POA: Diagnosis not present

## 2024-09-12 DIAGNOSIS — M2142 Flat foot [pes planus] (acquired), left foot: Secondary | ICD-10-CM

## 2024-09-12 DIAGNOSIS — M2141 Flat foot [pes planus] (acquired), right foot: Secondary | ICD-10-CM | POA: Diagnosis not present

## 2024-09-12 DIAGNOSIS — M216X2 Other acquired deformities of left foot: Secondary | ICD-10-CM

## 2024-09-12 DIAGNOSIS — G8929 Other chronic pain: Secondary | ICD-10-CM

## 2024-09-12 NOTE — Progress Notes (Signed)
 Subjective:  Patient ID: Brianna Gilmore, female    DOB: [birth year],  MRN: [id number]  Chief Complaint  Patient presents with   Foot Pain    NP -left ankle pain x 2 months. No trauma. Pain scale 7/10 at the end of the day    28 y.o. female presents with the above complaint.  She states that she has had pain for approximately 2 months that is worse at the end of the day, reaching a 7 out of 10.  She states that she is knocked kneed, flatfoot and is overweight.  She believes that all of these are contributing.  In the past, she did have an ankle sprain and since that time does experience some clicking around the ankle.  Review of Systems: Negative except as noted in the HPI. Denies N/V/F/Ch.  Past Medical History:  Diagnosis Date   Allergy    Asthma    Childhood   Headache    ~28 years old when pt stopped having regular migraines   HELLP syndrome    Hypertension     Current Outpatient Medications:    gabapentin  (NEURONTIN ) 100 MG capsule, Take 1 capsule (100 mg total) by mouth 3 (three) times daily. May start once nightly due to sedation effects, Disp: 90 capsule, Rfl: 3   predniSONE  (DELTASONE ) 10 MG tablet, Take 60mg  PO daily x1d, then 50mg  daily x1d, then 40mg  daily x1d, then 30mg  daily x1d, then 20mg  daily x1d, then 10mg  daily x1d, then stop, Disp: 21 tablet, Rfl: 0   Vitamin D , Ergocalciferol , (DRISDOL ) 1.25 MG (50000 UNIT) CAPS capsule, Take 1 capsule (50,000 Units total) by mouth every 7 (seven) days., Disp: 5 capsule, Rfl: 4  Social History   Tobacco Use  Smoking Status Every Day   Types: Cigars  Smokeless Tobacco Never    Allergies  Allergen Reactions   Doxycycline Shortness Of Breath    Unable to breathe, SOB   Zithromax [Azithromycin] Hives   Penicillins Rash   Objective:  There were no vitals filed for this visit. There is no height or weight on file to calculate BMI. Constitutional Well developed. Well nourished. Oriented to person, place, and time.   Vascular Dorsalis pedis pulses palpable bilaterally. Posterior tibial pulses palpable bilaterally. Capillary refill normal to all digits.  No cyanosis or clubbing noted. Pedal hair growth normal.  Neurologic Normal speech. Epicritic sensation to light touch grossly present bilaterally. Negative tinel sign at tarsal tunnel bilaterally.   Dermatologic Skin texture and turgor are within normal limits.  No open wounds. No skin lesions.  Musculoskeletal: 5 out of 5 muscle strength to all major pedal muscle groups.  There is pain to palpation of the navicular tuberosity and the distal posterior tibial tendon, extending just proximal to the ankle.  Pain to palpation of the sinus tarsi.  No pain with TN, subtalar joint range of motion.  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  No pain to palpation of the lateral ligament complex.  Moderate pain to palpation of anterior gutter of ankle. ROM painfree.    Radiographs: Taken and reviewed.  3 weightbearing views of the left ankle were taken today.  These demonstrate a congruous ankle with preserved joint space.  No acute osseous pathology is identified.  The calcaneus does appear to be sitting in a position of eversion.  Cannot rule out talar medial gutter osteochondral lesion, but may  be artifact of imaging.  Os trigonum and posterior heel spur present.  Assessment:   1. Acute left ankle pain   2. Acquired pes planus of both feet   3. Chronic pain of left knee   4. Morbid obesity (HCC)    Plan:  - Patient was evaluated and treated and all questions answered.  Acquired pes planus of both feet Acute left ankle pain Morbid obesity -Discussed the diagnosis of acquired, flexible pes planus bilaterally with the patient.  We did discuss that her knock knee gait and excessive weight are placing extra weight onto the posterior tibial  tendon. -Discussed offloading the posterior tibial tendon via stable shoe gear, supportive insoles and an ankle brace to decrease eversion force across the ankle and subtalar.  She would like to acquire these on her own. -Discussed stretching her Achilles tendon and posterior calf to decrease pronatory forces on the rear foot - Discussed that the patient may be having pathology within her ankle however her x-ray does appear benign in this regard.  I discussed that offloading the posterior tibial tendon will likely improve her ankle pain. - Return to clinic in 2 months if pain continues.  Preclinical foot x-rays of the left foot at that time. Consider MRI to evaluate flat foot and ankle pain if pathology continues.    Return in about 2 months (around 11/12/2024).  Prentice Ovens, DPM AACFAS Fellowship Trained Podiatric Surgeon [facility]

## 2024-10-22 ENCOUNTER — Ambulatory Visit
Admission: RE | Admit: 2024-10-22 | Discharge: 2024-10-22 | Disposition: A | Discharge status: Home / Self Care | Source: Ambulatory Visit | Attending: Emergency Medicine | Admitting: Emergency Medicine

## 2024-10-22 VITALS — BP 141/82 | HR 72 | Temp 98.3°F | Resp 16

## 2024-10-22 DIAGNOSIS — M79675 Pain in left toe(s): Secondary | ICD-10-CM | POA: Diagnosis not present

## 2024-10-22 DIAGNOSIS — G8929 Other chronic pain: Secondary | ICD-10-CM

## 2024-10-22 MED ORDER — PREDNISONE 10 MG (21) PO TBPK: ORAL_TABLET | Freq: Every day | ORAL | 0 refills | Status: DC

## 2024-10-22 NOTE — Discharge Instructions (Signed)
 Your evaluated for your toe pain, low suspicion for injury to the bone as there was no injury therefore we have held off on imaging, most likely irritation to the tissue or the joint,  while low suspicion could be inflammatory process such as arthritis  Uric Acid level to check for gout is pending, you will be notified of any concerning values  Begin prednisone  every morning with food as directed to reduce inflammation and help with pain, avoid ibuprofen  while taking you may use Tylenol  or any topical medicines  May continue to apply heat over the affected area 10 to 15-minute intervals  May elevate whenever sitting and lying to further help reduce pain  If your symptoms continue to persist or worsen please follow-up with your podiatrist for further evaluation

## 2024-10-22 NOTE — ED Provider Notes (Signed)
 [facility]    CSN: [id number] Arrival date & time: 10/22/24  1442      History   Chief Complaint Chief Complaint  Patient presents with   Foot Pain    Entered by patient    HPI Brianna Gilmore is a 28 y.o. female.   Patient presents for evaluation of left fifth toe pain swelling and erythema beginning 1 day ago.  Pain radiates into the heel.  Symptoms started abruptly without precipitating event, injury or trauma.  Has been constant, felt with movement but able to bear weight and complete range of motion.  Associated numbness and tingling.  Past Medical History:  Diagnosis Date   Allergy    Asthma    Childhood   Headache    ~28 years old when pt stopped having regular migraines   HELLP syndrome    Hypertension     Patient Active Problem List   Diagnosis Date Noted   Vitamin D  deficiency 09/05/2024   Morbid obesity (HCC) 09/05/2024   Uses contraceptive implant for birth control 09/05/2024   Excessive sweating 03/06/2024   Chronic left shoulder pain 03/06/2024   Chronic pain of left knee 03/06/2024   Left upper arm pain 03/06/2024   Tension headache 03/06/2024   Encounter for surveillance of Nexplanon  subdermal contraceptive 03/06/2024   Encounter to establish care with new doctor 03/06/2024   Tobacco dependence with current use 03/06/2024   HELLP (hemolytic anemia/elev liver enzymes/low platelets in pregnancy), antepartum 03/22/2022   Acquired pes planus of both feet 02/22/2022   Genu valgus, congenital 02/22/2022   Asthma 06/12/2013    Past Surgical History:  Procedure Laterality Date   CESAREAN SECTION N/A 03/24/2022   Procedure: CESAREAN SECTION;  Surgeon: Lorence Ozell CROME, MD;  Location: [facility];  Service: Obstetrics;  Laterality: N/A;   WISDOM TOOTH EXTRACTION      OB History     Gravida  2   Para  1   Term      Preterm  1   AB  1   Living  1      SAB  1   IAB      Ectopic      Multiple  0   Live Births  1             Home Medications    Prior to Admission medications   Medication Sig Start Date End Date Taking? Authorizing Provider  predniSONE  (STERAPRED UNI-PAK 21 TAB) 10 MG (21) TBPK tablet Take by mouth daily. Take 6 tabs by mouth daily  for 1 days, then 5 tabs for 1 days, then 4 tabs for 1 days, then 3 tabs for 1 days, 2 tabs for 1 days, then 1 tab by mouth daily for 1 days 10/22/24  Yes Molly Maselli R, NP  gabapentin  (NEURONTIN ) 100 MG capsule Take 1 capsule (100 mg total) by mouth 3 (three) times daily. May start once nightly due to sedation effects 09/05/24   Clifton, Kellie A, FNP  Vitamin D , Ergocalciferol , (DRISDOL ) 1.25 MG (50000 UNIT) CAPS capsule Take 1 capsule (50,000 Units total) by mouth every 7 (seven) days. 09/05/24   Wellington Curtis LABOR, FNP    Family History Family History  Problem Relation Age of Onset   Hypothyroidism Daughter    Diabetes Maternal Grandmother    Hypertension Maternal Grandmother     Social History Social History   Tobacco Use   Smoking status: Every Day    Types: Cigars  Smokeless tobacco: Never  Vaping Use   Vaping status: Never Used  Substance Use Topics   Alcohol use: No   Drug use: Not Currently     Allergies   Doxycycline, Zithromax [azithromycin], and Penicillins   Review of Systems Review of Systems   Physical Exam Triage Vital Signs ED Triage Vitals  Encounter Vitals Group     BP 10/22/24 1503 (!) 141/82     Girls Systolic BP Percentile --      Girls Diastolic BP Percentile --      Boys Systolic BP Percentile --      Boys Diastolic BP Percentile --      Pulse Rate 10/22/24 1503 72     Resp 10/22/24 1503 16     Temp 10/22/24 1503 98.3 F (36.8 C)     Temp Source 10/22/24 1503 Oral     SpO2 10/22/24 1503 98 %     Weight --      Height --      Head Circumference --      Peak Flow --      Pain Score 10/22/24 1512 7     Pain Loc --      Pain Education --      Exclude from Growth Chart --    No data found.  Updated  Vital Signs BP (!) 141/82 (BP Location: Left Arm)   Pulse 72   Temp 98.3 F (36.8 C) (Oral)   Resp 16   LMP  (LMP Unknown)   SpO2 98%   Visual Acuity Right Eye Distance:   Left Eye Distance:   Bilateral Distance:    Right Eye Near:   Left Eye Near:    Bilateral Near:     Physical Exam Constitutional:      Appearance: Normal appearance.  Eyes:     Extraocular Movements: Extraocular movements intact.  Pulmonary:     Effort: Pulmonary effort is normal.  Skin:    Comments: Erythema swelling and tenderness present to the middle phalanx of the left fifth toe, heightened sensations, capillary refill less than 3, 2+ pedal pulse  Neurological:     Mental Status: She is alert and oriented to person, place, and time.      UC Treatments / Results  Labs (all labs ordered are listed, but only abnormal results are displayed) Labs Reviewed  URIC ACID    EKG   Radiology No results found.  Procedures Procedures (including critical care time)  Medications Ordered in UC Medications - No data to display  Initial Impression / Assessment and Plan / UC Course  I have reviewed the triage vital signs and the nursing notes.  Pertinent labs & imaging results that were available during my care of the patient were reviewed by me and considered in my medical decision making (see chart for details).  Left toe pain  Deferring x-ray due to lack of injury, discussed this with patient, possibly inflammatory process, low suspicion of arthritis due to age, possible gout, uric acid level pending, endorses recent cruise with high consumption of alcohol and red meat, prescribed prednisone  for treatment and recommended supportive care through [facility], advised podiatry follow-up if symptoms persist Final Clinical Impressions(s) / UC Diagnoses   Final diagnoses:  Toe pain, left     Discharge Instructions      Your evaluated for your toe pain, low suspicion for injury to the bone as there was  no injury therefore we have held off on imaging, most likely  irritation to the tissue or the joint,  while low suspicion could be inflammatory process such as arthritis  Uric Acid level to check for gout is pending, you will be notified of any concerning values  Begin prednisone  every morning with food as directed to reduce inflammation and help with pain, avoid ibuprofen  while taking you may use Tylenol  or any topical medicines  May continue to apply heat over the affected area 10 to 15-minute intervals  May elevate whenever sitting and lying to further help reduce pain  If your symptoms continue to persist or worsen please follow-up with your podiatrist for further evaluation   ED Prescriptions     Medication Sig Dispense Auth. Provider   predniSONE  (STERAPRED UNI-PAK 21 TAB) 10 MG (21) TBPK tablet Take by mouth daily. Take 6 tabs by mouth daily  for 1 days, then 5 tabs for 1 days, then 4 tabs for 1 days, then 3 tabs for 1 days, 2 tabs for 1 days, then 1 tab by mouth daily for 1 days 21 tablet Dashay Giesler, Shelba SAUNDERS, NP      PDMP not reviewed this encounter.   Teresa Shelba SAUNDERS, NP 10/22/24 [phone]

## 2024-10-22 NOTE — ED Triage Notes (Signed)
 Pt states she started having a sharp pain in her heel that goes to her toe on her left foot, with redness and tender to touch x 2 days. Pt says her foot feels itchy and burns at times.

## 2024-10-23 ENCOUNTER — Ambulatory Visit (HOSPITAL_COMMUNITY): Payer: Self-pay

## 2024-10-23 LAB — URIC ACID: Uric Acid: 5.2 mg/dL (ref 2.6–6.2)

## 2024-11-04 ENCOUNTER — Ambulatory Visit: Admitting: Family Medicine

## 2024-11-04 ENCOUNTER — Encounter: Payer: Self-pay | Admitting: Family Medicine

## 2024-11-04 VITALS — BP 135/88 | HR 80 | Temp 98.3°F | Ht 64.0 in | Wt 287.0 lb

## 2024-11-04 DIAGNOSIS — M79672 Pain in left foot: Secondary | ICD-10-CM

## 2024-11-04 DIAGNOSIS — E559 Vitamin D deficiency, unspecified: Secondary | ICD-10-CM

## 2024-11-04 DIAGNOSIS — R7303 Prediabetes: Secondary | ICD-10-CM

## 2024-11-04 DIAGNOSIS — R030 Elevated blood-pressure reading, without diagnosis of hypertension: Secondary | ICD-10-CM

## 2024-11-04 NOTE — Progress Notes (Signed)
 Established Patient Office Visit  Introduced to nurse practitioner role and practice setting.  All questions answered.  Discussed provider/patient relationship and expectations.   Subjective   Patient ID: Brianna Gilmore, female    DOB: 01-Feb-1996  Age: 28 y.o. MRN: [id number]  Chief Complaint  Patient presents with   Follow-up    Pt c/o left lower leg px x2 weeks.     Discussed the use of AI scribe software for clinical note transcription with the patient, who gave verbal consent to proceed.  History of Present Illness Brianna Gilmore is a 28 year old female who presents for chronic disease mgmt and has concerns for lower leg pain and itching sensation in Left foot.  She has been experiencing lower leg pain and itching for the past two weeks, beginning with an itchy sensation on her pinky toe that spread to her entire foot. This was followed by a pins and needles sensation, particularly when stepping, as if circulation was being cut off. The pain radiates from her pinky toe to her shin, sometimes feeling like her shin is 'cracking' when she steps.  She visited urgent care, on 10/22/24, where blood work was done to rule out gout, and she was prescribed a prednisone  pack, which she completed over six days. Despite this, she continues to experience symptoms. She denies any recent injury but mentions extensive walking during a cruise the week prior to symptom onset. She has a history of seeing a podiatrist a few months ago, who recommended insoles after reviewing her x-ray - which findings were benign.  Currently, she describes the pain as sometimes itchy, other times like pins and needles, and occasionally burning. The pain can travel down the back of her leg and is present even when not standing. She notes a raised bump on her pinky toe and numbness at the top of the toe. Swelling was present initially but subsided after a couple of days. She can walk and wiggle her toes, and her right foot  is unaffected.  She has been managing the pain with Tylenol  and has not tried gabapentin , which was previously prescribed for shoulder pain. She has not used any NSAIDs like ibuprofen  or naproxen yet. She mentions using heat for relief, although it sometimes exacerbates the pain.       11/04/2024   11:15 AM 09/05/2024   11:30 AM 03/05/2024    3:51 PM  Depression screen PHQ 2/9  Decreased Interest 1 1 1   Down, Depressed, Hopeless 1 1 0  PHQ - 2 Score 2 2 1   Altered sleeping 0 1 0  Tired, decreased energy 1 1 1   Change in appetite 1 1 1   Feeling bad or failure about yourself  1 1 0  Trouble concentrating 0 0 1  Moving slowly or fidgety/restless 0 0 0  Suicidal thoughts 0 0 0  PHQ-9 Score 5 6  4    Difficult doing work/chores Somewhat difficult Not difficult at all Not difficult at all     Data saved with a previous flowsheet row definition       11/04/2024   11:15 AM 09/05/2024   11:30 AM 03/05/2024    3:53 PM  GAD 7 : Generalized Anxiety Score  Nervous, Anxious, on Edge 1 0 1  Control/stop worrying 0 1 0  Worry too much - different things 1 1 0  Trouble relaxing 0 0 0  Restless 0 0 0  Easily annoyed or irritable 1 1 1   Afraid - awful might  happen 1 1 0  Total GAD 7 Score 4 4 2   Anxiety Difficulty Not difficult at all Not difficult at all Not difficult at all   ROS  Negative unless indicated in HPI   Objective:     BP 135/88   Pulse 80   Temp 98.3 F (36.8 C)   Ht 5' 4 (1.626 m)   Wt 287 lb (130.2 kg)   LMP  (LMP Unknown)   SpO2 99%   BMI 49.26 kg/m    Physical Exam Constitutional:      General: She is not in acute distress.    Appearance: Normal appearance. She is obese. She is not ill-appearing, toxic-appearing or diaphoretic.  HENT:     Head: Normocephalic.     Nose: Nose normal.     Mouth/Throat:     Mouth: Mucous membranes are moist.     Pharynx: Oropharynx is clear.  Eyes:     Extraocular Movements: Extraocular movements intact.     Pupils:  Pupils are equal, round, and reactive to light.  Cardiovascular:     Rate and Rhythm: Normal rate and regular rhythm.     Pulses: Normal pulses.          Dorsalis pedis pulses are 2+ on the right side and 2+ on the left side.       Posterior tibial pulses are 2+ on the right side and 2+ on the left side.     Heart sounds: Normal heart sounds. No murmur heard.    No friction rub. No gallop.  Pulmonary:     Effort: No respiratory distress.     Breath sounds: No stridor. No wheezing, rhonchi or rales.  Chest:     Chest wall: No tenderness.  Musculoskeletal:        General: Tenderness present. No swelling, deformity or signs of injury.     Right hip: Normal.     Left hip: Normal.     Right upper leg: Normal.     Left upper leg: Normal.     Right knee: Normal.     Left knee: Normal.     Right lower leg: No edema.     Left lower leg: No tenderness or bony tenderness. No edema.     Left ankle: No swelling, deformity, ecchymosis or lacerations. Tenderness present over the lateral malleolus. Normal range of motion.     Right foot: Normal range of motion. No deformity, bunion, Charcot foot, foot drop, prominent metatarsal heads or tenderness.     Left foot: Normal range of motion. Tenderness present. No deformity, bunion, Charcot foot, foot drop or prominent metatarsal heads.  Feet:     Right foot:     Skin integrity: Skin integrity normal.     Toenail Condition: Right toenails are normal.     Left foot:     Skin integrity: Skin integrity normal.     Toenail Condition: Left toenails are normal.  Skin:    General: Skin is warm and dry.     Capillary Refill: Capillary refill takes less than 2 seconds.     Coloration: Skin is not jaundiced or pale.     Findings: No bruising, erythema, lesion or rash.  Neurological:     General: No focal deficit present.     Mental Status: She is alert and oriented to person, place, and time. Mental status is at baseline.  Psychiatric:        Mood and  Affect: Mood normal.  Behavior: Behavior normal.        Thought Content: Thought content normal.        Judgment: Judgment normal.      No results found for any visits on 11/04/24.    The ASCVD Risk score (Arnett DK, et al., 2019) failed to calculate for the following reasons:   The 2019 ASCVD risk score is only valid for ages 68 to 11    Assessment & Plan:  Left foot pain -     DG Foot Complete Left; Future -     DG Ankle Complete Left; Future  Elevated blood-pressure reading without diagnosis of hypertension -     Comprehensive metabolic panel with GFR  Morbid obesity (HCC) -     TSH -     Lipid panel  Prediabetes -     Hemoglobin A1c  Vitamin D  deficiency -     VITAMIN D  25 Hydroxy (Vit-D Deficiency, Fractures)     Assessment and Plan Assessment & Plan Left foot pain - Acute on chronic,  - hx of Left ankle pain - went on cruise, and wore poor fitting shoes - could be soft tissue injury due to this - was seen at urgent care on 10/22/24 - no imaging done due to no injury, given prednisone  pack  - associated with left shin pain and radiculopathy in left foot: including itching, paresthesia, and burning sensation, persisting for three weeks.  - Pain is most severe in the left foot and shin, with occasional radiation down the leg.  - No recent injury - Previous prednisone  treatment completed without resolution.  - nerve pain vs soft tissue injury, versus bone injury - tenderness to lateral portion of L malleolus - ROM intact, full strength, no obvious skin malformation or injury  - Previous x-ray showed no structural abnormalities per podiatry note - recommend proper fitting shoes - was told to return to podiatry in two month if pain persists. - Ordered foot x-ray to assess for underlying structural changes based line previous imaging. - Recommended trial of gabapentin  for neuropathic pain - was prescribed for should pain never used. - Advised use of NSAIDs such  as ibuprofen  or naproxen for inflammation and pain management. - Encouraged use of well-fitting shoes and orthotics as previously recommended by podiatry- pt is wearing Uggs today - not best for foot  - Suggested follow-up with podiatry for any further concerns.  Elevated blood pressure without diagnosis of hypertension Blood pressure readings have been elevated, with recent measurements in the 130s/80s range.  - Previous readings were within normal limits.  - No current diagnosis of hypertension.  - Pain and stress today may be contributing to elevated readings.  - Given age - Lifestyle modifications are preferred over medication initiation at this time, 3 months of lifestyle intervention. - Advised home blood pressure monitoring to assess for persistent elevation. - GOAL<120/80 - Recommended lifestyle modifications including reduced sodium and caffeine intake, increased water  consumption, and regular exercise. - Will reassess blood pressure management in 3-4 months or sooner if home readings remain elevated. - Denies vision changes, urination changes, chest pain, headaches, palpitations, SOB, DOB  Prediabetes Previous A1c was 5.9, indicating prediabetes. Monitoring is warranted given potential impact on sensation. - Ordered labs to reassess A1c Goal<5.7%  Morbid Obesity Continue to make conscious decisions for well balanced diet smaller portions with increase protein, fruits, veggies, water  as drink of choice, decrease starches, processed foods, and saturated fats. Increase weekly exercise - 150 minutes per week.  Vitamin D  Deficiency - check levels today - continue supplement   Return in about 4 months (around 03/04/2025) for Chronic Disease and BP check.   I, Curtis DELENA Boom, FNP, have reviewed all documentation for this visit. The documentation on 11/04/24 for the exam, diagnosis, procedures, and orders are all accurate and complete.   Curtis DELENA Boom, FNP

## 2025-05-05 ENCOUNTER — Encounter

## 2025-05-14 ENCOUNTER — Encounter
